# Patient Record
Sex: Female | Born: 1945
Health system: Southern US, Community
[De-identification: ages and names within clinical notes are randomized; demographics above are authoritative.]

## PROBLEM LIST (undated history)

## (undated) DIAGNOSIS — E119 Type 2 diabetes mellitus without complications: Secondary | ICD-10-CM

## (undated) DIAGNOSIS — N189 Chronic kidney disease, unspecified: Secondary | ICD-10-CM

## (undated) DIAGNOSIS — E785 Hyperlipidemia, unspecified: Secondary | ICD-10-CM

## (undated) DIAGNOSIS — N6452 Nipple discharge: Secondary | ICD-10-CM

## (undated) HISTORY — PX: OVARIAN CYST REMOVAL: SHX89

## (undated) HISTORY — PX: COLONOSCOPY: SHX174

---

## 2003-08-04 ENCOUNTER — Other Ambulatory Visit: Admission: RE | Admit: 2003-08-04 | Discharge: 2003-08-04 | Payer: Self-pay | Admitting: Family Medicine

## 2003-09-16 ENCOUNTER — Ambulatory Visit (HOSPITAL_COMMUNITY): Admission: RE | Admit: 2003-09-16 | Discharge: 2003-09-16 | Payer: Self-pay | Admitting: Gastroenterology

## 2005-07-13 ENCOUNTER — Ambulatory Visit (HOSPITAL_COMMUNITY): Admission: RE | Admit: 2005-07-13 | Discharge: 2005-07-13 | Payer: Self-pay | Admitting: Family Medicine

## 2005-08-06 ENCOUNTER — Encounter: Admission: RE | Admit: 2005-08-06 | Discharge: 2005-08-06 | Payer: Self-pay | Admitting: Family Medicine

## 2007-09-23 ENCOUNTER — Encounter: Admission: RE | Admit: 2007-09-23 | Discharge: 2007-09-23 | Payer: Self-pay | Admitting: Family Medicine

## 2010-04-30 ENCOUNTER — Encounter: Payer: Self-pay | Admitting: Family Medicine

## 2010-08-25 NOTE — Op Note (Signed)
NAMEKHALIYAH, BURROUS                           ACCOUNT NO.:  [id number]   MEDICAL RECORD NO.:  [id number]                   PATIENT TYPE:  AMB   LOCATION:  ENDO                                 FACILITY:  [facility]   PHYSICIAN:  James L. Rolla Flatten., M.D.          DATE OF BIRTH:  01/04/1946   DATE OF PROCEDURE:  09/16/2003  DATE OF DISCHARGE:                                 OPERATIVE REPORT   PROCEDURE:  Colonoscopy.   MEDICATIONS GIVEN:  Fentanyl 75 mcg, Versed 7 mg IV.   INDICATIONS FOR PROCEDURE:  Heme positive stool.   DESCRIPTION OF PROCEDURE:  The procedure was explained and patient consent  obtained.  With the patient in the left lateral decubitus position, the  Olympus pediatric scope was inserted and advanced.  The scope was withdrawn  after the cecum had been reached.  The ileocecal valve and appendiceal  orifice were seen.  The cecum, ascending colon, transverse colon, descending  and sigmoid colon were seen well.  The patient had pandiverticulosis,  multiple large and small amount of diverticula extending throughout the  entire colon.  No polyps seen throughout the entire colon.  The sigmoid  colon, in particular, did reveal marked diverticular disease.  The scope was  withdrawn to the rectum.  The rectum was free of polyps and other lesions.  There were moderate internal hemorrhoids seen in the rectum upon the removal  of the scope.  The scope was withdrawn.  The patient tolerated the procedure  well and was maintained on low flow oxygen and pulse oximetry throughout the  procedure.   ASSESSMENT:  1. Heme positive stool, 792.1, no obvious cause on colonoscopy other than     hemorrhoids.  2. Pandiverticulosis.   PLAN:  Will see back in the office in 3-4 months.  Will give a  diverticulosis information sheet.                                               James L. Rolla Flatten., M.D.    Jaynie Bream  D:  09/16/2003  T:  09/16/2003  Job:  [id number]   cc:   Judithann Sauger,  M.D.  [address] [place]  [place] [address]  Fax: [phone]

## 2011-03-14 ENCOUNTER — Other Ambulatory Visit: Payer: Self-pay | Admitting: Family Medicine

## 2011-03-14 DIAGNOSIS — Z1231 Encounter for screening mammogram for malignant neoplasm of breast: Secondary | ICD-10-CM

## 2011-04-17 ENCOUNTER — Ambulatory Visit: Payer: Self-pay

## 2011-04-17 ENCOUNTER — Other Ambulatory Visit: Payer: Self-pay | Admitting: Family Medicine

## 2011-04-17 ENCOUNTER — Ambulatory Visit
Admission: RE | Admit: 2011-04-17 | Discharge: 2011-04-17 | Disposition: A | Discharge status: Home / Self Care | Payer: PRIVATE HEALTH INSURANCE | Source: Ambulatory Visit | Attending: Family Medicine | Admitting: Family Medicine

## 2011-04-17 DIAGNOSIS — N6315 Unspecified lump in the right breast, overlapping quadrants: Secondary | ICD-10-CM

## 2011-04-17 DIAGNOSIS — N6310 Unspecified lump in the right breast, unspecified quadrant: Secondary | ICD-10-CM

## 2013-03-27 ENCOUNTER — Other Ambulatory Visit: Payer: Self-pay

## 2013-03-27 DIAGNOSIS — Z1231 Encounter for screening mammogram for malignant neoplasm of breast: Secondary | ICD-10-CM

## 2013-04-03 ENCOUNTER — Ambulatory Visit
Admission: RE | Admit: 2013-04-03 | Discharge: 2013-04-03 | Disposition: A | Discharge status: Home / Self Care | Payer: PRIVATE HEALTH INSURANCE | Source: Ambulatory Visit

## 2013-04-03 DIAGNOSIS — Z1231 Encounter for screening mammogram for malignant neoplasm of breast: Secondary | ICD-10-CM

## 2014-02-19 ENCOUNTER — Other Ambulatory Visit: Payer: Self-pay

## 2014-02-19 DIAGNOSIS — Z1231 Encounter for screening mammogram for malignant neoplasm of breast: Secondary | ICD-10-CM

## 2014-04-05 ENCOUNTER — Encounter (INDEPENDENT_AMBULATORY_CARE_PROVIDER_SITE_OTHER): Payer: Self-pay

## 2014-04-05 ENCOUNTER — Ambulatory Visit
Admission: RE | Admit: 2014-04-05 | Discharge: 2014-04-05 | Disposition: A | Discharge status: Home / Self Care | Payer: Medicare Other | Source: Ambulatory Visit

## 2014-04-05 DIAGNOSIS — Z1231 Encounter for screening mammogram for malignant neoplasm of breast: Secondary | ICD-10-CM

## 2014-10-04 DIAGNOSIS — H40013 Open angle with borderline findings, low risk, bilateral: Secondary | ICD-10-CM | POA: Diagnosis not present

## 2015-04-06 ENCOUNTER — Other Ambulatory Visit: Payer: Self-pay

## 2015-04-06 DIAGNOSIS — Z1231 Encounter for screening mammogram for malignant neoplasm of breast: Secondary | ICD-10-CM

## 2015-04-29 ENCOUNTER — Ambulatory Visit
Admission: RE | Admit: 2015-04-29 | Discharge: 2015-04-29 | Disposition: A | Discharge status: Home / Self Care | Payer: Medicare Other | Source: Ambulatory Visit

## 2015-04-29 DIAGNOSIS — Z1231 Encounter for screening mammogram for malignant neoplasm of breast: Secondary | ICD-10-CM | POA: Diagnosis not present

## 2016-03-19 DIAGNOSIS — L03314 Cellulitis of groin: Secondary | ICD-10-CM | POA: Diagnosis not present

## 2016-03-19 DIAGNOSIS — R799 Abnormal finding of blood chemistry, unspecified: Secondary | ICD-10-CM | POA: Diagnosis not present

## 2016-03-19 DIAGNOSIS — L02214 Cutaneous abscess of groin: Secondary | ICD-10-CM | POA: Diagnosis not present

## 2016-03-19 DIAGNOSIS — L0390 Cellulitis, unspecified: Secondary | ICD-10-CM | POA: Diagnosis not present

## 2016-11-30 ENCOUNTER — Other Ambulatory Visit: Payer: Self-pay | Admitting: Family Medicine

## 2016-11-30 DIAGNOSIS — Z1231 Encounter for screening mammogram for malignant neoplasm of breast: Secondary | ICD-10-CM

## 2016-12-03 ENCOUNTER — Ambulatory Visit
Admission: RE | Admit: 2016-12-03 | Discharge: 2016-12-03 | Disposition: A | Discharge status: Home / Self Care | Payer: Medicare Other | Source: Ambulatory Visit | Attending: Family Medicine | Admitting: Family Medicine

## 2016-12-03 DIAGNOSIS — Z1231 Encounter for screening mammogram for malignant neoplasm of breast: Secondary | ICD-10-CM

## 2017-02-13 DIAGNOSIS — E1165 Type 2 diabetes mellitus with hyperglycemia: Secondary | ICD-10-CM | POA: Diagnosis not present

## 2017-02-13 DIAGNOSIS — B029 Zoster without complications: Secondary | ICD-10-CM | POA: Diagnosis not present

## 2017-03-12 DIAGNOSIS — Z23 Encounter for immunization: Secondary | ICD-10-CM | POA: Diagnosis not present

## 2017-04-01 DIAGNOSIS — N183 Chronic kidney disease, stage 3 (moderate): Secondary | ICD-10-CM | POA: Diagnosis not present

## 2017-04-01 DIAGNOSIS — R5383 Other fatigue: Secondary | ICD-10-CM | POA: Diagnosis not present

## 2017-04-01 DIAGNOSIS — E1165 Type 2 diabetes mellitus with hyperglycemia: Secondary | ICD-10-CM | POA: Diagnosis not present

## 2017-04-01 DIAGNOSIS — E663 Overweight: Secondary | ICD-10-CM | POA: Diagnosis not present

## 2017-04-01 DIAGNOSIS — R000 Tachycardia, unspecified: Secondary | ICD-10-CM | POA: Diagnosis not present

## 2017-04-01 DIAGNOSIS — E1122 Type 2 diabetes mellitus with diabetic chronic kidney disease: Secondary | ICD-10-CM | POA: Diagnosis not present

## 2017-04-01 DIAGNOSIS — Z780 Asymptomatic menopausal state: Secondary | ICD-10-CM | POA: Diagnosis not present

## 2017-04-30 DIAGNOSIS — Z780 Asymptomatic menopausal state: Secondary | ICD-10-CM | POA: Diagnosis not present

## 2017-06-03 DIAGNOSIS — D72828 Other elevated white blood cell count: Secondary | ICD-10-CM | POA: Diagnosis not present

## 2017-06-03 DIAGNOSIS — Z23 Encounter for immunization: Secondary | ICD-10-CM | POA: Diagnosis not present

## 2017-06-03 DIAGNOSIS — N183 Chronic kidney disease, stage 3 (moderate): Secondary | ICD-10-CM | POA: Diagnosis not present

## 2017-06-03 DIAGNOSIS — E559 Vitamin D deficiency, unspecified: Secondary | ICD-10-CM | POA: Diagnosis not present

## 2017-06-03 DIAGNOSIS — E1122 Type 2 diabetes mellitus with diabetic chronic kidney disease: Secondary | ICD-10-CM | POA: Diagnosis not present

## 2017-06-03 DIAGNOSIS — R000 Tachycardia, unspecified: Secondary | ICD-10-CM | POA: Diagnosis not present

## 2017-06-03 DIAGNOSIS — Z7984 Long term (current) use of oral hypoglycemic drugs: Secondary | ICD-10-CM | POA: Diagnosis not present

## 2017-07-23 DIAGNOSIS — E118 Type 2 diabetes mellitus with unspecified complications: Secondary | ICD-10-CM | POA: Diagnosis not present

## 2017-09-06 DIAGNOSIS — E559 Vitamin D deficiency, unspecified: Secondary | ICD-10-CM | POA: Diagnosis not present

## 2017-09-06 DIAGNOSIS — Z6828 Body mass index (BMI) 28.0-28.9, adult: Secondary | ICD-10-CM | POA: Diagnosis not present

## 2017-09-06 DIAGNOSIS — E1122 Type 2 diabetes mellitus with diabetic chronic kidney disease: Secondary | ICD-10-CM | POA: Diagnosis not present

## 2017-09-06 DIAGNOSIS — N6452 Nipple discharge: Secondary | ICD-10-CM | POA: Diagnosis not present

## 2017-09-06 DIAGNOSIS — N183 Chronic kidney disease, stage 3 (moderate): Secondary | ICD-10-CM | POA: Diagnosis not present

## 2017-09-09 ENCOUNTER — Other Ambulatory Visit: Payer: Self-pay | Admitting: Family Medicine

## 2017-09-09 DIAGNOSIS — N6452 Nipple discharge: Secondary | ICD-10-CM

## 2017-09-11 ENCOUNTER — Other Ambulatory Visit: Payer: Self-pay | Admitting: Family Medicine

## 2017-09-11 ENCOUNTER — Ambulatory Visit
Admission: RE | Admit: 2017-09-11 | Discharge: 2017-09-11 | Disposition: A | Discharge status: Home / Self Care | Payer: Medicare Other | Source: Ambulatory Visit | Attending: Family Medicine | Admitting: Family Medicine

## 2017-09-11 DIAGNOSIS — N6320 Unspecified lump in the left breast, unspecified quadrant: Secondary | ICD-10-CM

## 2017-09-11 DIAGNOSIS — N6452 Nipple discharge: Secondary | ICD-10-CM

## 2017-09-11 DIAGNOSIS — R928 Other abnormal and inconclusive findings on diagnostic imaging of breast: Secondary | ICD-10-CM | POA: Diagnosis not present

## 2017-09-12 DIAGNOSIS — E118 Type 2 diabetes mellitus with unspecified complications: Secondary | ICD-10-CM | POA: Diagnosis not present

## 2017-09-13 ENCOUNTER — Other Ambulatory Visit: Payer: Self-pay | Admitting: Family Medicine

## 2017-09-13 ENCOUNTER — Ambulatory Visit
Admission: RE | Admit: 2017-09-13 | Discharge: 2017-09-13 | Disposition: A | Discharge status: Home / Self Care | Payer: Medicare Other | Source: Ambulatory Visit | Attending: Family Medicine | Admitting: Family Medicine

## 2017-09-13 DIAGNOSIS — N6320 Unspecified lump in the left breast, unspecified quadrant: Secondary | ICD-10-CM

## 2017-09-13 DIAGNOSIS — N6342 Unspecified lump in left breast, subareolar: Secondary | ICD-10-CM | POA: Diagnosis not present

## 2017-09-13 DIAGNOSIS — N6452 Nipple discharge: Secondary | ICD-10-CM | POA: Diagnosis not present

## 2017-09-27 DIAGNOSIS — N6452 Nipple discharge: Secondary | ICD-10-CM | POA: Diagnosis not present

## 2017-10-02 ENCOUNTER — Ambulatory Visit: Payer: Self-pay | Admitting: Surgery

## 2017-10-22 DIAGNOSIS — K5730 Diverticulosis of large intestine without perforation or abscess without bleeding: Secondary | ICD-10-CM | POA: Diagnosis not present

## 2017-10-22 DIAGNOSIS — Z1211 Encounter for screening for malignant neoplasm of colon: Secondary | ICD-10-CM | POA: Diagnosis not present

## 2017-10-22 DIAGNOSIS — K640 First degree hemorrhoids: Secondary | ICD-10-CM | POA: Diagnosis not present

## 2017-10-22 DIAGNOSIS — D126 Benign neoplasm of colon, unspecified: Secondary | ICD-10-CM | POA: Diagnosis not present

## 2017-10-23 ENCOUNTER — Encounter (HOSPITAL_BASED_OUTPATIENT_CLINIC_OR_DEPARTMENT_OTHER): Payer: Self-pay | Admitting: *Deleted

## 2017-10-23 ENCOUNTER — Other Ambulatory Visit: Payer: Self-pay

## 2017-10-25 ENCOUNTER — Encounter (HOSPITAL_BASED_OUTPATIENT_CLINIC_OR_DEPARTMENT_OTHER)
Admission: RE | Admit: 2017-10-25 | Discharge: 2017-10-25 | Disposition: A | Discharge status: Home / Self Care | Payer: Medicare Other | Source: Ambulatory Visit | Attending: Surgery | Admitting: Surgery

## 2017-10-25 DIAGNOSIS — E119 Type 2 diabetes mellitus without complications: Secondary | ICD-10-CM | POA: Diagnosis not present

## 2017-10-25 DIAGNOSIS — D126 Benign neoplasm of colon, unspecified: Secondary | ICD-10-CM | POA: Diagnosis not present

## 2017-10-25 DIAGNOSIS — Z01810 Encounter for preprocedural cardiovascular examination: Secondary | ICD-10-CM | POA: Diagnosis not present

## 2017-10-25 DIAGNOSIS — Z01812 Encounter for preprocedural laboratory examination: Secondary | ICD-10-CM | POA: Insufficient documentation

## 2017-10-25 LAB — BASIC METABOLIC PANEL
ANION GAP: 9 (ref 5–15)
BUN: 14 mg/dL (ref 8–23)
CALCIUM: 9.5 mg/dL (ref 8.9–10.3)
CO2: 27 mmol/L (ref 22–32)
Chloride: 106 mmol/L (ref 98–111)
Creatinine, Ser: 0.89 mg/dL (ref 0.44–1.00)
GFR calc Af Amer: >60 mL/min (ref >60)
GLUCOSE: 180 mg/dL — AB (ref 70–99)
Potassium: 4.2 mmol/L (ref 3.5–5.1)
Sodium: 142 mmol/L (ref 135–145)

## 2017-10-25 NOTE — Progress Notes (Signed)
Ensure pre surgery drink given with instructions to complete by 0600 dos, pt verbalized understanding. 

## 2017-10-30 NOTE — H&P (Signed)
Tamara Pruitt Location: [facility] Patient #: [id number] DOB: 03/14/1946 Undefined / Language: Tamara Pruitt / Race: White Female  History of Present Illness   The patient is a 72 year old female who presents with a complaint of changes in breast.  The PCP is Dr. Kathyrn Lass  The patient was referred by Dr. Kathyrn Lass  She comes by herself.  She took care of a sick husband for quite some time. He died in 07/24/15. She neglected herself while taking care of him. She is now trying to get back into a healthy lifestyle. She had noticed a left nipple discharge for couple months. At first she did not realize this was coming from her nipple. She is not on hormone medicine. She has a strong family history of breast cancer in that her grandmother, mother, and sister all had breast cancer. She knows of family genetic testing. She has had no prior breast biopsies.  Left breast biopsy on 09/13/2017 [phone]) showed benign breast tissue. This biospy was considered concordant. I gave her a copy of the path report. She still has a nipple discharge. I spoke to Dr. Curlene Dolphin. There is no reason to get the clip out (though it may out with the specimen). She has a persistent nipple discharge despite a benign biopsy. She has a strong family history of breast cancer. I think she is best served by having a left breast biopsy to excise the ductal system causing the discharge. I can feel a duct that comes out at 3:00 and that releases bloody fluid when I press on it.  Plan: 1) Left breast biopsy to excise duct causing discharge  Past Medical History: 1. DM - on metformin 2. CKD III 3. She is on a statin 4. She had abdominal surgery for an ovarian cyst in 1987. 5. She had endometriosis 6. She is up for a colonsopy by Dr. Oletta Lamas  Social History: Widowed. Lives by self. She has one child: 38 yo daughter  Tamara Pruitt retired. 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   Past Surgical History (Tanisha A. Owens Shark, [place]; 09/27/2017 4:24 PM) Breast Biopsy  Left. Cesarean Section - 1  Gallbladder Surgery - Laparoscopic   Diagnostic Studies History (Tanisha A. Owens Shark, [place]; 09/27/2017 4:24 PM) Colonoscopy  >10 years ago Mammogram  within last year Pap Smear  >5 years ago  Allergies (Tanisha A. Owens Shark, [place]; 09/27/2017 4:25 PM) No Known Drug Allergies [09/27/2017]: Allergies Reconciled   Medication History (Tanisha A. Owens Shark, RMA; 09/27/2017 4:25 PM) MetFORMIN HCl (500MG  Tablet, Oral) Active. Vitamin D (Ergocalciferol) (50000UNIT Capsule, Oral) Active. Medications Reconciled  Social History (Tanisha A. Owens Shark, [place]; 09/27/2017 4:24 PM) Caffeine use  Coffee, Tea. No alcohol use  No drug use  Tobacco use  Never smoker.  Family History (Tanisha A. Owens Shark, [place]; 09/27/2017 4:24 PM) Arthritis  Father. Breast Cancer  Mother, Sister. Heart Disease  Sister.  Pregnancy / Birth History (Tanisha A. Owens Shark, [place]; 09/27/2017 4:24 PM) Age at menarche  48 years. Age of menopause  12-60 Contraceptive History  Intrauterine device, Oral contraceptives. Gravida  2 Irregular periods  Length (months) of breastfeeding  7-12 Maternal age  40-20 Para  1  Other Problems (Tanisha A. Owens Shark, [place]; 09/27/2017 4:24 PM) Diabetes Mellitus     Review of Systems (Tanisha A. Brown RMA; 09/27/2017 4:24 PM) General Not Present- Appetite Loss, Chills, Fatigue, Fever, Night Sweats, Weight Gain and Weight Loss. Skin Not  Present- Change in Wart/Mole, Dryness, Hives, Jaundice, New Lesions, Non-Healing Wounds, Rash and Ulcer. HEENT Not Present- Earache, Hearing Loss, Hoarseness, Nose Bleed, Oral Ulcers, Ringing in the Ears, Seasonal Allergies, Sinus Pain, Sore Throat, Visual Disturbances, Wears glasses/contact lenses and Yellow Eyes. Respiratory Not Present- Bloody sputum, Chronic Cough, Difficulty  Breathing, Snoring and Wheezing. Breast Present- Nipple Discharge. Not Present- Breast Mass, Breast Pain and Skin Changes. Cardiovascular Not Present- Chest Pain, Difficulty Breathing Lying Down, Leg Cramps, Palpitations, Rapid Heart Rate, Shortness of Breath and Swelling of Extremities. Gastrointestinal Not Present- Abdominal Pain, Bloating, Bloody Stool, Change in Bowel Habits, Chronic diarrhea, Constipation, Difficulty Swallowing, Excessive gas, Gets full quickly at meals, Hemorrhoids, Indigestion, Nausea, Rectal Pain and Vomiting. Female Genitourinary Not Present- Frequency, Nocturia, Painful Urination, Pelvic Pain and Urgency. Musculoskeletal Not Present- Back Pain, Joint Pain, Joint Stiffness, Muscle Pain, Muscle Weakness and Swelling of Extremities. Neurological Not Present- Decreased Memory, Fainting, Headaches, Numbness, Seizures, Tingling, Tremor, Trouble walking and Weakness. Psychiatric Not Present- Anxiety, Bipolar, Change in Sleep Pattern, Depression, Fearful and Frequent crying. Endocrine Not Present- Cold Intolerance, Excessive Hunger, Hair Changes, Heat Intolerance, Hot flashes and New Diabetes. Hematology Not Present- Blood Thinners, Easy Bruising, Excessive bleeding, Gland problems, HIV and Persistent Infections.  Vitals (Tanisha A. Brown RMA; 09/27/2017 4:24 PM) 09/27/2017 4:24 PM Weight: 174.4 lb Height: 65in Body Surface Area: 1.87 m Body Mass Index: 29.02 kg/m  Temp.: 98.73F  Pulse: 78 (Regular)  BP: 128/72 (Sitting, Left Arm, Standard)   Physical Exam  General: Older WF who is alert and generally healthy appearing. Skin: Inspection and palpation of the skin unremarkable.  Eyes: Conjunctivae white, pupils equal. Face, ears, nose, mouth, and throat: Face - normal. Normal ears and nose. Lips and teeth normal.  Neck: Supple. No mass. Trachea midline. No thyroid mass.  Lymph Nodes: No supraclavicular or cervical adenopathy. No axillary  adenopathy.  Lungs: Normal respiratory effort. Clear to auscultation and symmetric breath sounds. Cardiovascular: Regular rate and rythm. Normal auscultation of the heart. No murmur or rub.  Breasts: Right - no mass or nodule Left - I can feel a "cord" that is coming out at 3 o'clock of the left nipple. On pressure, I can produce a very small amount of dark bloody fluid. I think this is the abnormal duct  Abdomen: Soft. No mass. Liver and spleen not palpable. No tenderness. No hernia. Normal bowel sounds.  Rectal: Not done.  Musculoskeletal/extremities: Normal gait. Good strength and ROM in upper and lower extremities.   Neurologic: Grossly intact to motor and sensory function.   Psychiatric: Has normal mood and affect. Judgement and insight appear normal.  Assessment & Plan  1.  Left NIPPLE DISCHARGE (N64.52)  Plan:  1) Left breast biopsy  2. DM - on metformin 3. CKD III 4. She is on a statin 5. She had abdominal surgery for an ovarian cyst in 1987. 6. She had endometriosis   Alphonsa Overall, MD, [facility] Pager: [phone] Office phone:  [phone]

## 2017-10-31 ENCOUNTER — Ambulatory Visit (HOSPITAL_BASED_OUTPATIENT_CLINIC_OR_DEPARTMENT_OTHER)
Admission: RE | Admit: 2017-10-31 | Discharge: 2017-10-31 | Disposition: A | Discharge status: Home / Self Care | Payer: Medicare Other | Source: Ambulatory Visit | Attending: Surgery | Admitting: Surgery

## 2017-10-31 ENCOUNTER — Ambulatory Visit (HOSPITAL_BASED_OUTPATIENT_CLINIC_OR_DEPARTMENT_OTHER): Payer: Medicare Other | Admitting: Certified Registered"

## 2017-10-31 ENCOUNTER — Encounter (HOSPITAL_BASED_OUTPATIENT_CLINIC_OR_DEPARTMENT_OTHER)
Admission: RE | Disposition: A | Discharge status: Home / Self Care | Payer: Self-pay | Source: Ambulatory Visit | Attending: Surgery

## 2017-10-31 ENCOUNTER — Other Ambulatory Visit: Payer: Self-pay

## 2017-10-31 ENCOUNTER — Encounter (HOSPITAL_BASED_OUTPATIENT_CLINIC_OR_DEPARTMENT_OTHER): Payer: Self-pay

## 2017-10-31 DIAGNOSIS — N6042 Mammary duct ectasia of left breast: Secondary | ICD-10-CM | POA: Diagnosis not present

## 2017-10-31 DIAGNOSIS — E119 Type 2 diabetes mellitus without complications: Secondary | ICD-10-CM | POA: Diagnosis not present

## 2017-10-31 DIAGNOSIS — Z7984 Long term (current) use of oral hypoglycemic drugs: Secondary | ICD-10-CM | POA: Diagnosis not present

## 2017-10-31 DIAGNOSIS — N6452 Nipple discharge: Secondary | ICD-10-CM | POA: Insufficient documentation

## 2017-10-31 HISTORY — PX: BREAST BIOPSY: SHX20

## 2017-10-31 HISTORY — DX: Chronic kidney disease, unspecified: N18.9

## 2017-10-31 HISTORY — DX: Nipple discharge: N64.52

## 2017-10-31 HISTORY — DX: Type 2 diabetes mellitus without complications: E11.9

## 2017-10-31 HISTORY — DX: Hyperlipidemia, unspecified: E78.5

## 2017-10-31 LAB — GLUCOSE, CAPILLARY
Glucose-Capillary: 243 mg/dL — ABNORMAL HIGH (ref 70–99)
Glucose-Capillary: 181 mg/dL — ABNORMAL HIGH (ref 70–99)

## 2017-10-31 SURGERY — BREAST BIOPSY
Anesthesia: General | Site: Breast | Laterality: Left

## 2017-10-31 MED ORDER — ONDANSETRON HCL 4 MG/2ML IJ SOLN
INTRAMUSCULAR | Status: AC
  Filled 2017-10-31: qty 2

## 2017-10-31 MED ORDER — CHLORHEXIDINE GLUCONATE CLOTH 2 % EX PADS: 6.0000 | MEDICATED_PAD | Freq: Once | CUTANEOUS | Status: DC

## 2017-10-31 MED ORDER — MIDAZOLAM HCL 2 MG/2ML IJ SOLN
INTRAMUSCULAR | Status: AC
  Filled 2017-10-31: qty 2

## 2017-10-31 MED ORDER — SCOPOLAMINE 1 MG/3DAYS TD PT72: 1.0000 | MEDICATED_PATCH | Freq: Once | TRANSDERMAL | Status: DC | PRN

## 2017-10-31 MED ORDER — CEFAZOLIN SODIUM-DEXTROSE 2-4 GM/100ML-% IV SOLN
INTRAVENOUS | Status: AC
  Filled 2017-10-31: qty 100

## 2017-10-31 MED ORDER — 0.9 % SODIUM CHLORIDE (POUR BTL) OPTIME
TOPICAL | Status: DC | PRN
  Administered 2017-10-31: 1000 mL

## 2017-10-31 MED ORDER — EPHEDRINE SULFATE 50 MG/ML IJ SOLN
INTRAMUSCULAR | Status: DC | PRN
  Administered 2017-10-31: 10 mg via INTRAVENOUS

## 2017-10-31 MED ORDER — PROPOFOL 10 MG/ML IV BOLUS
INTRAVENOUS | Status: AC
  Filled 2017-10-31: qty 20

## 2017-10-31 MED ORDER — LIDOCAINE HCL (CARDIAC) PF 100 MG/5ML IV SOSY
PREFILLED_SYRINGE | INTRAVENOUS | Status: AC
  Filled 2017-10-31: qty 5

## 2017-10-31 MED ORDER — LIDOCAINE HCL (CARDIAC) PF 100 MG/5ML IV SOSY
PREFILLED_SYRINGE | INTRAVENOUS | Status: DC | PRN
  Administered 2017-10-31: 60 mg via INTRAVENOUS

## 2017-10-31 MED ORDER — FENTANYL CITRATE (PF) 100 MCG/2ML IJ SOLN
INTRAMUSCULAR | Status: AC
  Filled 2017-10-31: qty 2

## 2017-10-31 MED ORDER — ACETAMINOPHEN 500 MG PO TABS
1000.0000 mg | ORAL_TABLET | ORAL | Status: AC
  Administered 2017-10-31: 1000 mg via ORAL

## 2017-10-31 MED ORDER — FENTANYL CITRATE (PF) 100 MCG/2ML IJ SOLN: 25.0000 ug | INTRAMUSCULAR | Status: DC | PRN

## 2017-10-31 MED ORDER — PROPOFOL 10 MG/ML IV BOLUS
INTRAVENOUS | Status: DC | PRN
  Administered 2017-10-31: 140 mg via INTRAVENOUS

## 2017-10-31 MED ORDER — DEXAMETHASONE SODIUM PHOSPHATE 10 MG/ML IJ SOLN
INTRAMUSCULAR | Status: AC
  Filled 2017-10-31: qty 1

## 2017-10-31 MED ORDER — FENTANYL CITRATE (PF) 100 MCG/2ML IJ SOLN
50.0000 ug | INTRAMUSCULAR | Status: DC | PRN
  Administered 2017-10-31 (×2): 50 ug via INTRAVENOUS

## 2017-10-31 MED ORDER — ONDANSETRON HCL 4 MG/2ML IJ SOLN: 4.0000 mg | Freq: Once | INTRAMUSCULAR | Status: DC | PRN

## 2017-10-31 MED ORDER — GABAPENTIN 300 MG PO CAPS
ORAL_CAPSULE | ORAL | Status: AC
  Filled 2017-10-31: qty 1

## 2017-10-31 MED ORDER — GABAPENTIN 300 MG PO CAPS
300.0000 mg | ORAL_CAPSULE | ORAL | Status: AC
  Administered 2017-10-31: 300 mg via ORAL

## 2017-10-31 MED ORDER — CEFAZOLIN SODIUM-DEXTROSE 2-4 GM/100ML-% IV SOLN: 2.0000 g | INTRAVENOUS | Status: DC

## 2017-10-31 MED ORDER — HYDROCODONE-ACETAMINOPHEN 5-325 MG PO TABS: 1.0000 | ORAL_TABLET | Freq: Four times a day (QID) | ORAL | 0 refills | Status: DC | PRN

## 2017-10-31 MED ORDER — ACETAMINOPHEN 500 MG PO TABS
ORAL_TABLET | ORAL | Status: AC
  Filled 2017-10-31: qty 2

## 2017-10-31 MED ORDER — BUPIVACAINE-EPINEPHRINE (PF) 0.25% -1:200000 IJ SOLN
INTRAMUSCULAR | Status: DC | PRN
  Administered 2017-10-31: 30 mL

## 2017-10-31 MED ORDER — MIDAZOLAM HCL 2 MG/2ML IJ SOLN
1.0000 mg | INTRAMUSCULAR | Status: DC | PRN
  Administered 2017-10-31: 1 mg via INTRAVENOUS

## 2017-10-31 MED ORDER — LACTATED RINGERS IV SOLN
INTRAVENOUS | Status: DC
  Administered 2017-10-31: 09:00:00 via INTRAVENOUS

## 2017-10-31 MED ORDER — ONDANSETRON HCL 4 MG/2ML IJ SOLN
INTRAMUSCULAR | Status: DC | PRN
Start: 2017-10-31 — End: 2017-10-31
  Administered 2017-10-31: 4 mg via INTRAVENOUS

## 2017-10-31 MED ORDER — CEFAZOLIN SODIUM-DEXTROSE 2-3 GM-%(50ML) IV SOLR
INTRAVENOUS | Status: DC | PRN
  Administered 2017-10-31: 2 g via INTRAVENOUS

## 2017-10-31 SURGICAL SUPPLY — 45 items
BENZOIN TINCTURE PRP APPL 2/3 (GAUZE/BANDAGES/DRESSINGS) IMPLANT
BINDER BREAST LRG (GAUZE/BANDAGES/DRESSINGS) IMPLANT
BINDER BREAST MEDIUM (GAUZE/BANDAGES/DRESSINGS) IMPLANT
BINDER BREAST XLRG (GAUZE/BANDAGES/DRESSINGS) ×3 IMPLANT
BINDER BREAST XXLRG (GAUZE/BANDAGES/DRESSINGS) IMPLANT
BLADE SURG 10 STRL SS (BLADE) ×3 IMPLANT
BLADE SURG 15 STRL LF DISP TIS (BLADE) IMPLANT
BLADE SURG 15 STRL SS (BLADE)
CANISTER SUCT 1200ML W/VALVE (MISCELLANEOUS) ×3 IMPLANT
CHLORAPREP W/TINT 26ML (MISCELLANEOUS) ×3 IMPLANT
CLIP VESOCCLUDE SM WIDE 6/CT (CLIP) IMPLANT
CLOSURE WOUND 1/4X4 (GAUZE/BANDAGES/DRESSINGS)
COVER BACK TABLE 60X90IN (DRAPES) ×3 IMPLANT
COVER MAYO STAND STRL (DRAPES) ×3 IMPLANT
DECANTER SPIKE VIAL GLASS SM (MISCELLANEOUS) IMPLANT
DERMABOND ADVANCED (GAUZE/BANDAGES/DRESSINGS)
DERMABOND ADVANCED .7 DNX12 (GAUZE/BANDAGES/DRESSINGS) IMPLANT
DEVICE DUBIN W/COMP PLATE 8390 (MISCELLANEOUS) IMPLANT
DRAPE LAPAROTOMY 100X72 PEDS (DRAPES) ×3 IMPLANT
DRAPE UTILITY XL STRL (DRAPES) ×3 IMPLANT
ELECT COATED BLADE 2.86 ST (ELECTRODE) ×3 IMPLANT
ELECT REM PT RETURN 9FT ADLT (ELECTROSURGICAL) ×3
ELECTRODE REM PT RTRN 9FT ADLT (ELECTROSURGICAL) ×1 IMPLANT
GAUZE SPONGE 4X4 12PLY STRL LF (GAUZE/BANDAGES/DRESSINGS) IMPLANT
GLOVE SURG SIGNA 7.5 PF LTX (GLOVE) ×3 IMPLANT
GOWN STRL REUS W/ TWL LRG LVL3 (GOWN DISPOSABLE) ×1 IMPLANT
GOWN STRL REUS W/ TWL XL LVL3 (GOWN DISPOSABLE) ×1 IMPLANT
GOWN STRL REUS W/TWL LRG LVL3 (GOWN DISPOSABLE) ×2
GOWN STRL REUS W/TWL XL LVL3 (GOWN DISPOSABLE) ×2
KIT MARKER MARGIN INK (KITS) IMPLANT
NEEDLE HYPO 25X1 1.5 SAFETY (NEEDLE) ×3 IMPLANT
NS IRRIG 1000ML POUR BTL (IV SOLUTION) ×3 IMPLANT
PACK BASIN DAY SURGERY FS (CUSTOM PROCEDURE TRAY) ×3 IMPLANT
PENCIL BUTTON HOLSTER BLD 10FT (ELECTRODE) ×3 IMPLANT
SHEET MEDIUM DRAPE 40X70 STRL (DRAPES) IMPLANT
SLEEVE SCD COMPRESS KNEE MED (MISCELLANEOUS) ×3 IMPLANT
SPONGE LAP 18X18 RF (DISPOSABLE) ×3 IMPLANT
STRIP CLOSURE SKIN 1/4X4 (GAUZE/BANDAGES/DRESSINGS) IMPLANT
SUT MNCRL AB 4-0 PS2 18 (SUTURE) ×3 IMPLANT
SUT VICRYL 3-0 CR8 SH (SUTURE) ×3 IMPLANT
SYR CONTROL 10ML LL (SYRINGE) ×3 IMPLANT
TOWEL OR NON WOVEN STRL DISP B (DISPOSABLE) ×3 IMPLANT
TUBE CONNECTING 20'X1/4 (TUBING) ×1
TUBE CONNECTING 20X1/4 (TUBING) ×2 IMPLANT
YANKAUER SUCT BULB TIP NO VENT (SUCTIONS) ×3 IMPLANT

## 2017-10-31 NOTE — Transfer of Care (Signed)
Immediate Anesthesia Transfer of Care Note  Patient: Tamara Pruitt  Procedure(s) Performed: LEFT BREAST BIOPSY (Left Breast)  Patient Location: PACU  Anesthesia Type:General  Level of Consciousness: awake, alert  and oriented  Airway & Oxygen Therapy: Patient Spontanous Breathing and Patient connected to face mask oxygen  Post-op Assessment: Report given to RN and Post -op Vital signs reviewed and stable  Post vital signs: Reviewed and stable  Last Vitals:  Vitals Value Taken Time  BP 126/81 10/31/2017 10:12 AM  Temp    Pulse 123 10/31/2017 10:13 AM  Resp 13 10/31/2017 10:13 AM  SpO2 97 % 10/31/2017 10:13 AM  Vitals shown include unvalidated device data.  Last Pain:  Vitals:   10/31/17 0825  TempSrc: Oral  PainSc: 0-No pain         Complications: No apparent anesthesia complications

## 2017-10-31 NOTE — Discharge Instructions (Signed)
[facility] - DISCHARGE INSTRUCTIONS TO PATIENT  Activity:  Driving - May drive in 1 or 2 days   Lifting - no lifting more than 15 pounds for 5 days, then no limit  Wound Care:   Leave bandage for 2 days.  Then remove bandage and shower.  Diet:  As tolerated.  Follow up appointment:  Call Dr. Pollie Friar office [facility]) at [phone] for an appointment in 2 to 4 weeks.  Medications and dosages:  Resume your home medications.  You have a prescription for:  Vicodin.  Call Dr. Lucia Gaskins or his office  [phone]) if you have:  Temperature greater than 100.4,  Persistent nausea and vomiting,  Severe uncontrolled pain,  Redness, tenderness, or signs of infection (pain, swelling, redness, odor or green/yellow discharge around the site),  Difficulty breathing, headache or visual disturbances,  Any other questions or concerns you may have after discharge.  In an emergency, call 911 or go to an Emergency Department at a nearby hospital.    Post Anesthesia Home Care Instructions  Activity: Get plenty of rest for the remainder of the day. A responsible individual must stay with you for 24 hours following the procedure.  For the next 24 hours, DO NOT: -Drive a car -Paediatric nurse -Drink alcoholic beverages -Take any medication unless instructed by your physician -Make any legal decisions or sign important papers.  Meals: Start with liquid foods such as gelatin or soup. Progress to regular foods as tolerated. Avoid greasy, spicy, heavy foods. If nausea and/or vomiting occur, drink only clear liquids until the nausea and/or vomiting subsides. Call your physician if vomiting continues.  Special Instructions/Symptoms: Your throat may feel dry or sore from the anesthesia or the breathing tube placed in your throat during surgery. If this causes discomfort, gargle with warm salt water. The discomfort should disappear within 24 hours.  If you had a  scopolamine patch placed behind your ear for the management of post- operative nausea and/or vomiting:  1. The medication in the patch is effective for 72 hours, after which it should be removed.  Wrap patch in a tissue and discard in the trash. Wash hands thoroughly with soap and water. 2. You may remove the patch earlier than 72 hours if you experience unpleasant side effects which may include dry mouth, dizziness or visual disturbances. 3. Avoid touching the patch. Wash your hands with soap and water after contact with the patch.   Call your surgeon if you experience:   1.  Fever over 101.0. 2.  Inability to urinate. 3.  Nausea and/or vomiting. 4.  Extreme swelling or bruising at the surgical site. 5.  Continued bleeding from the incision. 6.  Increased pain, redness or drainage from the incision. 7.  Problems related to your pain medication. 8.  Any problems and/or concerns

## 2017-10-31 NOTE — Anesthesia Procedure Notes (Signed)
Procedure Name: LMA Insertion Performed by: Verita Lamb, CRNA Pre-anesthesia Checklist: Patient identified, Emergency Drugs available, Suction available, Patient being monitored and Timeout performed Patient Re-evaluated:Patient Re-evaluated prior to induction Oxygen Delivery Method: Circle system utilized Preoxygenation: Pre-oxygenation with 100% oxygen Induction Type: IV induction LMA: LMA inserted LMA Size: 4.0 Tube type: Oral Number of attempts: 1 Placement Confirmation: positive ETCO2,  CO2 detector and breath sounds checked- equal and bilateral Tube secured with: Tape Dental Injury: Teeth and Oropharynx as per pre-operative assessment

## 2017-10-31 NOTE — Op Note (Addendum)
10/31/2017  10:06 AM  PATIENT:  Tamara Pruitt DOB: June 25, 1945 MRN: [id number]  PREOP DIAGNOSIS:  Left nipple discharge  POSTOP DIAGNOSIS:   Left nipple discharge, 3 o'clock position   PROCEDURE:   Procedure(s): LEFT BREAST Lumpecomty  SURGEON:   Alphonsa Overall, M.D.  ANESTHESIA:   general  Anesthesiologist: Catalina Gravel, MD CRNA: Verita Lamb, CRNA  General  EBL:  minimal  ml  DRAINS: none   LOCAL MEDICATIONS USED:   30 cc 1/4% marcaine  SPECIMEN:   Left breast biopsy (6 color paint kit)  COUNTS CORRECT:  YES  INDICATIONS FOR PROCEDURE:  [name] is a 72 y.o. (DOB: 1946-03-07) white female whose primary care physician is Kathyrn Lass, MD and comes for left breast lumpectomy.   She underwent a left breast biopsy on 09/13/2017 [phone]) showed benign breast tissue. This biospy was considered concordant.   I spoke to Dr. Curlene Dolphin. There is no reason to get the clip out (though it may out with the specimen).   The options for  treatment have been discussed with the patient. She elected to proceed with lumpectomy and axillary sentinel lymph node.     The indications and potential complications of surgery were explained to the patient. Potential complications include, but are not limited to, bleeding, infection, the need for further surgery, and nerve injury.    OPERATIVE NOTE:   The patient was taken to room # 8 at [facility] where she underwent a general anesthesia  supervised by Anesthesiologist: Catalina Gravel, MD CRNA: Verita Lamb, CRNA. Her left breast and axilla were prepped with  ChloraPrep and sterilely draped.    A time-out and the surgical check list was reviewed.    She has a recurrent nipple discharge of her left breast.  She has a palpable duct at the 3 o'clock position, that when I pressed on it produced a bloody nipple discharge.   I made a lateral circumareolar incision.  I excised this block of breast tissue  approximately 3 cm by 3 cm  in diameter.  I cut across some ducts that were dilated with pasty material.   I painted the biopsy specimen with the 6 color paint kit.     I then irrigated the wound with saline. I infiltrated approximately 30 mL of 1/4% marcaine in the incision.  I then closed all the wounds in layers using 3-0 Vicryl sutures for the deep layer. At the skin, I closed the incisions with a 4-0 Monocryl suture. The incisions were then painted with LiquiBand.  She had gauze place over the wounds and placed in a breast binder.   The patient tolerated the procedure well, was transported to the recovery room in good condition. Sponge and needle count were correct at the end of the case.   Final pathology is pending.   Alphonsa Overall, MD, [facility] Pager: [phone] Office phone:  [phone]

## 2017-10-31 NOTE — Anesthesia Preprocedure Evaluation (Addendum)
Anesthesia Evaluation  Patient identified by MRN, date of birth, ID band Patient awake    Reviewed: Allergy & Precautions, NPO status , Patient's Chart, lab work & pertinent test results  Airway Mallampati: II  TM Distance: >3 FB Neck ROM: Full    Dental  (+) Teeth Intact, Dental Advisory Given, Missing   Pulmonary neg pulmonary ROS,    Pulmonary exam normal breath sounds clear to auscultation       Cardiovascular Exercise Tolerance: Good (-) angina(-) Past MI negative cardio ROS Normal cardiovascular exam Rhythm:Regular Rate:Normal     Neuro/Psych negative neurological ROS  negative psych ROS   GI/Hepatic negative GI ROS, Neg liver ROS,   Endo/Other  diabetes, Type 2, Oral Hypoglycemic Agents  Renal/GU Renal InsufficiencyRenal disease     Musculoskeletal negative musculoskeletal ROS (+)   Abdominal   Peds  Hematology negative hematology ROS (+)   Anesthesia Other Findings Day of surgery medications reviewed with the patient.  Reproductive/Obstetrics                            Anesthesia Physical Anesthesia Plan  ASA: II  Anesthesia Plan: General   Post-op Pain Management:    Induction: Intravenous  PONV Risk Score and Plan: 3 and Dexamethasone and Ondansetron  Airway Management Planned: LMA  Additional Equipment:   Intra-op Plan:   Post-operative Plan: Extubation in OR  Informed Consent: I have reviewed the patients History and Physical, chart, labs and discussed the procedure including the risks, benefits and alternatives for the proposed anesthesia with the patient or authorized representative who has indicated his/her understanding and acceptance.   Dental advisory given  Plan Discussed with: CRNA  Anesthesia Plan Comments: (.)        Anesthesia Quick Evaluation

## 2017-10-31 NOTE — Interval H&P Note (Signed)
History and Physical Interval Note:  10/31/2017 9:22 AM  Tamara Pruitt  has presented today for surgery, with the diagnosis of Left nipple discharge  The various methods of treatment have been discussed with the patient and family. Grier Mitts, is with her.  She said that she has had no more discharge since I saw her in the office.   After consideration of risks, benefits and other options for treatment, the patient has consented to  Procedure(s): LEFT BREAST BIOPSY (Left) as a surgical intervention .  The patient's history has been reviewed, patient examined, no change in status, stable for surgery.  I have reviewed the patient's chart and labs.  Questions were answered to the patient's satisfaction.     Shann Medal

## 2017-11-01 ENCOUNTER — Encounter (HOSPITAL_BASED_OUTPATIENT_CLINIC_OR_DEPARTMENT_OTHER): Payer: Self-pay | Admitting: Surgery

## 2017-11-01 NOTE — Anesthesia Postprocedure Evaluation (Signed)
Anesthesia Post Note  Patient: Tamara Pruitt  Procedure(s) Performed: LEFT BREAST BIOPSY (Left Breast)     Patient location during evaluation: PACU Anesthesia Type: General Level of consciousness: awake and alert Pain management: pain level controlled Vital Signs Assessment: post-procedure vital signs reviewed and stable Respiratory status: spontaneous breathing, nonlabored ventilation and respiratory function stable Cardiovascular status: blood pressure returned to baseline and stable Postop Assessment: no apparent nausea or vomiting Anesthetic complications: no    Last Vitals:  Vitals:   10/31/17 1043 10/31/17 1055  BP:  128/84  Pulse: (!) 116 (!) 114  Resp: 18 16  Temp:  36.5 C  SpO2: 97% 95%    Last Pain:  Vitals:   10/31/17 1055  TempSrc:   PainSc: 0-No pain                 Catalina Gravel

## 2017-12-10 DIAGNOSIS — E559 Vitamin D deficiency, unspecified: Secondary | ICD-10-CM | POA: Diagnosis not present

## 2017-12-10 DIAGNOSIS — Z6829 Body mass index (BMI) 29.0-29.9, adult: Secondary | ICD-10-CM | POA: Diagnosis not present

## 2017-12-10 DIAGNOSIS — E1122 Type 2 diabetes mellitus with diabetic chronic kidney disease: Secondary | ICD-10-CM | POA: Diagnosis not present

## 2017-12-10 DIAGNOSIS — N183 Chronic kidney disease, stage 3 (moderate): Secondary | ICD-10-CM | POA: Diagnosis not present

## 2017-12-10 DIAGNOSIS — Z1159 Encounter for screening for other viral diseases: Secondary | ICD-10-CM | POA: Diagnosis not present

## 2018-02-01 DIAGNOSIS — Z23 Encounter for immunization: Secondary | ICD-10-CM | POA: Diagnosis not present

## 2018-03-14 DIAGNOSIS — N183 Chronic kidney disease, stage 3 (moderate): Secondary | ICD-10-CM | POA: Diagnosis not present

## 2018-03-14 DIAGNOSIS — E1122 Type 2 diabetes mellitus with diabetic chronic kidney disease: Secondary | ICD-10-CM | POA: Diagnosis not present

## 2018-03-14 DIAGNOSIS — Z6829 Body mass index (BMI) 29.0-29.9, adult: Secondary | ICD-10-CM | POA: Diagnosis not present

## 2018-09-17 DIAGNOSIS — Z7984 Long term (current) use of oral hypoglycemic drugs: Secondary | ICD-10-CM | POA: Diagnosis not present

## 2018-09-17 DIAGNOSIS — E1122 Type 2 diabetes mellitus with diabetic chronic kidney disease: Secondary | ICD-10-CM | POA: Diagnosis not present

## 2018-09-17 DIAGNOSIS — N183 Chronic kidney disease, stage 3 (moderate): Secondary | ICD-10-CM | POA: Diagnosis not present

## 2018-09-17 DIAGNOSIS — Z23 Encounter for immunization: Secondary | ICD-10-CM | POA: Diagnosis not present

## 2018-11-06 ENCOUNTER — Other Ambulatory Visit: Payer: Self-pay | Admitting: Family Medicine

## 2018-11-06 DIAGNOSIS — Z1231 Encounter for screening mammogram for malignant neoplasm of breast: Secondary | ICD-10-CM

## 2018-11-18 DIAGNOSIS — R350 Frequency of micturition: Secondary | ICD-10-CM | POA: Diagnosis not present

## 2018-12-22 ENCOUNTER — Other Ambulatory Visit: Payer: Self-pay

## 2018-12-22 ENCOUNTER — Ambulatory Visit
Admission: RE | Admit: 2018-12-22 | Discharge: 2018-12-22 | Disposition: A | Discharge status: Home / Self Care | Payer: Medicare Other | Source: Ambulatory Visit | Attending: Family Medicine | Admitting: Family Medicine

## 2018-12-22 DIAGNOSIS — Z1231 Encounter for screening mammogram for malignant neoplasm of breast: Secondary | ICD-10-CM | POA: Diagnosis not present

## 2019-01-30 DIAGNOSIS — Z23 Encounter for immunization: Secondary | ICD-10-CM | POA: Diagnosis not present

## 2019-03-20 DIAGNOSIS — N183 Chronic kidney disease, stage 3 unspecified: Secondary | ICD-10-CM | POA: Diagnosis not present

## 2019-03-20 DIAGNOSIS — E1122 Type 2 diabetes mellitus with diabetic chronic kidney disease: Secondary | ICD-10-CM | POA: Diagnosis not present

## 2019-03-20 DIAGNOSIS — E559 Vitamin D deficiency, unspecified: Secondary | ICD-10-CM | POA: Diagnosis not present

## 2019-05-22 DIAGNOSIS — Z23 Encounter for immunization: Secondary | ICD-10-CM | POA: Diagnosis not present

## 2019-06-20 DIAGNOSIS — Z23 Encounter for immunization: Secondary | ICD-10-CM | POA: Diagnosis not present

## 2019-11-03 ENCOUNTER — Other Ambulatory Visit: Payer: Self-pay

## 2019-11-03 ENCOUNTER — Ambulatory Visit (INDEPENDENT_AMBULATORY_CARE_PROVIDER_SITE_OTHER): Payer: Medicare Other | Admitting: Internal Medicine

## 2019-11-03 ENCOUNTER — Encounter: Payer: Self-pay | Admitting: Internal Medicine

## 2019-11-03 VITALS — BP 134/86 | HR 74 | Ht 65.0 in | Wt 179.8 lb

## 2019-11-03 DIAGNOSIS — R000 Tachycardia, unspecified: Secondary | ICD-10-CM | POA: Diagnosis not present

## 2019-11-03 DIAGNOSIS — N1832 Chronic kidney disease, stage 3b: Secondary | ICD-10-CM | POA: Diagnosis not present

## 2019-11-03 DIAGNOSIS — E119 Type 2 diabetes mellitus without complications: Secondary | ICD-10-CM | POA: Diagnosis not present

## 2019-11-03 DIAGNOSIS — E785 Hyperlipidemia, unspecified: Secondary | ICD-10-CM | POA: Diagnosis not present

## 2019-11-03 MED ORDER — METOPROLOL SUCCINATE ER 25 MG PO TB24: 25.0000 mg | ORAL_TABLET | Freq: Every day | ORAL | 3 refills | Status: DC

## 2019-11-03 NOTE — Patient Instructions (Signed)

## 2019-11-03 NOTE — Progress Notes (Signed)
Cardiology Office Note:    Date:  11/03/2019   ID:  Tamara Pruitt, DOB Nov 12, 1945, MRN [id number]  PCP:  Kathyrn Lass, MD  Cardiologist:  No primary care provider on file.  Electrophysiologist:  None   Referring MD: Kathyrn Lass, MD   Chief Complaint: Supraventricular tachycardia  History of Present Illness:    Tamara Pruitt is a 74 y.o. female with a history of type 2 diabetes, chronic kidney disease stage III, hyperlipidemia who presents for evaluation of supraventricular tachycardia.  Referral suggests query sinus tachycardia versus a reentrant tachycardia.  EKG from Dr. Ammie Ferrier office reviewed dated 10/05/2019.  This demonstrates sinus tachycardia with a rate of 110 beats a minute and left atrial enlargement.  We reviewed this EKG during our office visit.  We reviewed etiologies of sinus tachycardia.  EKG obtained at our office today demonstrates sinus rhythm, left atrial enlargement, and a rate of 74 bpm.  Patient overall feels well.  No significantly bothersome palpitations.  Was started on metoprolol and is tolerating well.  Denies chest pain, shortness of breath, PND, orthopnea, leg swelling, syncope, presyncope.  She is diabetic and continues on Metformin as well as a statin.  Laboratory studies reviewed, normal hemoglobin, platelet count, WBCs.  Normal TSH.  Patient has been fully vaccinated for COVID-19.  Hemoglobin A1c 7.1.  No recent lipid panel to review. Past Medical History:  Diagnosis Date  . Chronic kidney disease    stage 3 CKD  . Diabetes mellitus without complication ([place])   . Discharge from left nipple   . Hyperlipidemia     Past Surgical History:  Procedure Laterality Date  . BREAST BIOPSY Left 10/31/2017   Procedure: LEFT BREAST BIOPSY;  Surgeon: Alphonsa Overall, MD;  Location: [facility];  Service: General;  Laterality: Left;  . CESAREAN SECTION    . COLONOSCOPY    . OVARIAN CYST REMOVAL      Current Medications: Current Meds    Medication Sig  . atorvastatin (LIPITOR) 10 MG tablet Take 10 mg by mouth daily.  . metFORMIN (GLUCOPHAGE) 1000 MG tablet Take 1,000 mg by mouth 2 (two) times daily with a meal.   . metoprolol succinate (TOPROL-XL) 25 MG 24 hr tablet Take 25 mg by mouth daily.  . Vitamin D, Ergocalciferol, (DRISDOL) 50000 units CAPS capsule Take 50,000 Units by mouth every 7 (seven) days.     Allergies:   Patient has no known allergies.   Social History   Socioeconomic History  . Marital status: Widowed    Spouse name: Not on file  . Number of children: Not on file  . Years of education: Not on file  . Highest education level: Not on file  Occupational History  . Not on file  Tobacco Use  . Smoking status: Never Smoker  . Smokeless tobacco: Never Used  Substance and Sexual Activity  . Alcohol use: Never  . Drug use: Never  . Sexual activity: Not on file  Other Topics Concern  . Not on file  Social History Narrative  . Not on file   Social Determinants of Health   Financial Resource Strain:   . Difficulty of Paying Living Expenses:   Food Insecurity:   . Worried About Charity fundraiser in the Last Year:   . Arboriculturist in the Last Year:   Transportation Needs:   . Film/video editor (Medical):   Marland Kitchen Lack of Transportation (Non-Medical):   Physical Activity:   .  Days of Exercise per Week:   . Minutes of Exercise per Session:   Stress:   . Feeling of Stress :   Social Connections:   . Frequency of Communication with Friends and Family:   . Frequency of Social Gatherings with Friends and Family:   . Attends Religious Services:   . Active Member of Clubs or Organizations:   . Attends Archivist Meetings:   Marland Kitchen Marital Status:      Family History: The patient's family history includes Breast cancer in her maternal aunt and paternal aunt; Breast cancer (age of onset: 63) in her mother; Breast cancer (age of onset: 38) in her sister.  ROS:   Please see the history  of present illness.    All other systems reviewed and are negative.  EKGs/Labs/Other Studies Reviewed:    The following studies were reviewed today:  EKG:  NSR, LAE  Recent Labs: No results found for requested labs within last 8760 hours.  Recent Lipid Panel No results found for: CHOL, TRIG, HDL, CHOLHDL, VLDL, LDLCALC, LDLDIRECT  Physical Exam:    VS:  BP (!) 134/86   Pulse 74   Ht 5\' 5"  (1.651 m)   Wt 179 lb 12.8 oz (81.6 kg)   SpO2 99%   BMI 29.92 kg/m     Wt Readings from Last 5 Encounters:  11/03/19 179 lb 12.8 oz (81.6 kg)  10/31/17 176 lb (79.8 kg)     Constitutional: No acute distress Eyes: sclera non-icteric, normal conjunctiva and lids ENMT: normal dentition, moist mucous membranes Cardiovascular: regular rhythm, normal rate, no murmurs. S1 and S2 normal. Radial pulses normal bilaterally. No jugular venous distention.  Respiratory: clear to auscultation bilaterally GI : normal bowel sounds, soft and nontender. No distention.   MSK: extremities warm, well perfused. No edema.  NEURO: grossly nonfocal exam, moves all extremities. PSYCH: alert and oriented x 3, normal mood and affect.   ASSESSMENT:    1. Sinus tachycardia   2. Hyperlipidemia, unspecified hyperlipidemia type   3. Type 2 diabetes mellitus without complication, without long-term current use of insulin (HCC)   4. Stage 3b chronic kidney disease    PLAN:    Sinus tachycardia - Plan: EKG 12-Lead We participated in shared decision-making.  I have reviewed EKG from primary care doctor's office as well as our EKG today.  No definite features of a supra ventricular tachycardia or reentrant tachycardia at this time.  If patient has bothersome palpitations, I have offered a cardiac monitor.  In addition due to findings of left atrial enlargement on EKG, I have offered an echocardiogram.  At this time we will defer testing, and will observe symptoms.  I plan to follow-up with the patient in 6 months or  sooner as needed.  Okay to continue metoprolol, however sinus tachycardia does not require rate control and beta-blockade can be taken for symptoms or patient preference.  Hyperlipidemia, unspecified hyperlipidemia type-continue atorvastatin 10 mg daily.  Type 2 diabetes mellitus without complication, without long-term current use of insulin (HCC)-per PCP on Metformin and statin.  Stage 3b chronic kidney disease-monitored by PCP  Total time of encounter: 45 minutes total time of encounter, including 25 minutes spent in face-to-face patient care on the date of this encounter. This time includes coordination of care and counseling regarding above mentioned problem list. Remainder of non-face-to-face time involved reviewing chart documents/testing relevant to the patient encounter and documentation in the medical record. I have independently reviewed documentation from referring provider.  Approximately 25 pages of outside medical records reviewed in conjunction with this consultation.  Cherlynn Kaiser, MD   [facility]    Medication Adjustments/Labs and Tests Ordered: Current medicines are reviewed at length with the patient today.  Concerns regarding medicines are outlined above.  Orders Placed This Encounter  Procedures  . EKG 12-Lead   Meds ordered this encounter  Medications  . metoprolol succinate (TOPROL-XL) 25 MG 24 hr tablet    Sig: Take 1 tablet (25 mg total) by mouth daily.    Dispense:  90 tablet    Refill:  3    Patient Instructions  Medication Instructions:  Your Physician recommend you continue on your current medication as directed.    *If you need a refill on your cardiac medications before your next appointment, please call your pharmacy*   Lab Work: None   Testing/Procedures: None   Follow-Up: At [facility], you and your health needs are our priority.  As part of our continuing mission to provide you with exceptional heart care, we have  created designated Provider Care Teams.  These Care Teams include your primary Cardiologist (physician) and Advanced Practice Providers (APPs -  Physician Assistants and Nurse Practitioners) who all work together to provide you with the care you need, when you need it.  We recommend signing up for the patient portal called "MyChart".  Sign up information is provided on this After Visit Summary.  MyChart is used to connect with patients for Virtual Visits (Telemedicine).  Patients are able to view lab/test results, encounter notes, upcoming appointments, etc.  Non-urgent messages can be sent to your provider as well.   To learn more about what you can do with MyChart, go to [email or web address].    Your next appointment:   6 month(s)  The format for your next appointment:   In Person  Provider:   Buford Dresser, MD

## 2020-01-04 ENCOUNTER — Other Ambulatory Visit: Payer: Self-pay | Admitting: Family Medicine

## 2020-01-04 DIAGNOSIS — Z1231 Encounter for screening mammogram for malignant neoplasm of breast: Secondary | ICD-10-CM

## 2020-01-26 ENCOUNTER — Other Ambulatory Visit: Payer: Self-pay

## 2020-01-26 ENCOUNTER — Ambulatory Visit
Admission: RE | Admit: 2020-01-26 | Discharge: 2020-01-26 | Disposition: A | Discharge status: Home / Self Care | Payer: Medicare Other | Source: Ambulatory Visit | Attending: Family Medicine | Admitting: Family Medicine

## 2020-01-26 DIAGNOSIS — Z1231 Encounter for screening mammogram for malignant neoplasm of breast: Secondary | ICD-10-CM | POA: Diagnosis not present

## 2020-02-10 ENCOUNTER — Telehealth (INDEPENDENT_AMBULATORY_CARE_PROVIDER_SITE_OTHER): Payer: Medicare Other | Admitting: Internal Medicine

## 2020-02-10 ENCOUNTER — Encounter: Payer: Self-pay | Admitting: Internal Medicine

## 2020-02-10 VITALS — BP 150/79 | HR 77 | Ht 65.0 in | Wt 178.0 lb

## 2020-02-10 DIAGNOSIS — R072 Precordial pain: Secondary | ICD-10-CM

## 2020-02-10 DIAGNOSIS — Z01812 Encounter for preprocedural laboratory examination: Secondary | ICD-10-CM | POA: Diagnosis not present

## 2020-02-10 DIAGNOSIS — I10 Essential (primary) hypertension: Secondary | ICD-10-CM

## 2020-02-10 DIAGNOSIS — Z79899 Other long term (current) drug therapy: Secondary | ICD-10-CM

## 2020-02-10 DIAGNOSIS — R931 Abnormal findings on diagnostic imaging of heart and coronary circulation: Secondary | ICD-10-CM

## 2020-02-10 MED ORDER — LOSARTAN POTASSIUM 25 MG PO TABS
25.0000 mg | ORAL_TABLET | Freq: Every day | ORAL | 3 refills | Status: DC
Start: 2020-02-10 — End: 2020-03-18

## 2020-02-10 MED ORDER — METOPROLOL TARTRATE 50 MG PO TABS: 50.0000 mg | ORAL_TABLET | Freq: Once | ORAL | 0 refills | Status: DC

## 2020-02-10 NOTE — Patient Instructions (Addendum)
Medication Instructions:  PLEASE START LOSARTAN 42m (1 Tablet) DAILY *If you need a refill on your cardiac medications before your next appointment, please call your pharmacy*  Lab Work: PLEASE COME INTO OFFICE 11/9 for BLOOD WORK.  BMET 1 WEEK PRIOR TO CTA SCAN  If you have labs (blood work) drawn today and your tests are completely normal, you will receive your results only by: .Marland KitchenMyChart Message (if you have MyChart) OR . A paper copy in the mail If you have any lab test that is abnormal or we need to change your treatment, we will call you to review the results.  Testing/Procedures: Your physician has requested that you have cardiac CT. Cardiac computed tomography (CT) is a painless test that uses an x-ray machine to take clear, detailed pictures of your heart. For further information please visit [email or web address] Please follow instruction sheet as given.  Follow-Up: [facility] you and your health needs are our priority.  As part of our continuing mission to provide you with exceptional heart care, we have created designated Provider Care Teams.  These Care Teams include your primary Cardiologist (physician) and Advanced Practice Providers (APPs -  Physician Assistants and Nurse Practitioners) who all work together to provide you with the care you need, when you need it.  Your next appointment:   DECEMBER 10th 9:40am   The format for your next appointment:   Virtual Visit   Provider:   GCherlynn Kaiser MD  Other Instructions Your cardiac CT will be scheduled at one of the below locations:   [facility] [phone] [facility] minutes prior to test start time. [facility] (first floor) to check-in and test prep.  Please follow these instructions carefully (unless otherwise  directed):  On the Night Before the Test: . Be sure to Drink plenty of water. . Do not consume any caffeinated/decaffeinated beverages or chocolate 12 hours prior to your test. . Do not take any antihistamines 12 hours prior to your test.  On the Day of the Test: . Drink plenty of water. Do not drink any water within one hour of the test. . Do not eat any food 4 hours prior to the test. . You may take your regular medications prior to the test.  . Take metoprolol (Lopressor) two hours prior to test. . HOLD Furosemide/Hydrochlorothiazide morning of the test. . FEMALES- please wear underwire-free bra if available  After the Test: . Drink plenty of water. . After receiving IV contrast, you may experience a mild flushed feeling. This is normal. . On occasion, you may experience a mild rash up to 24 hours after the test. This is not dangerous. If this occurs, you can take Benadryl 25 mg and increase your fluid intake. . If you experience trouble breathing, this can be serious. If it is severe call 911 IMMEDIATELY. If it is mild, please call our office. . If you take any of these medications: Glipizide/Metformin, Avandament, Glucavance, please do not take 48 hours after completing test unless otherwise instructed.  Once we have confirmed authorization from your insurance company, we will call you to set up a date and time for your test. Based on how quickly your insurance processes prior authorizations requests, please allow up to 4 weeks to be contacted for scheduling your Cardiac CT appointment. Be advised  that routine Cardiac CT appointments could be scheduled as many as 8 weeks after your provider has ordered it.  For non-scheduling related questions, please contact the cardiac imaging nurse navigator should you have any questions/concerns: Marchia Bond, Cardiac Imaging Nurse Navigator Burley Saver, Interim Cardiac Imaging Nurse [place] and [facility] Services Direct Office  Dial: [phone]   For scheduling needs, including cancellations and rescheduling, please call Vivien Rota at [phone], option 3.

## 2020-02-10 NOTE — Progress Notes (Signed)
Virtual Visit via Telephone Note   This visit type was conducted due to national recommendations for restrictions regarding the COVID-19 Pandemic (e.g. social distancing) in an effort to limit this patient's exposure and mitigate transmission in our community.  Due to her co-morbid illnesses, this patient is at least at moderate risk for complications without adequate follow up.  This format is felt to be most appropriate for this patient at this time.  The patient did not have access to video technology/had technical difficulties with video requiring transitioning to audio format only (telephone).  All issues noted in this document were discussed and addressed.  No physical exam could be performed with this format.  Please refer to the patient's chart for her  consent to telehealth for [facility].    Date:  02/10/2020   ID:  Rafael Bihari, DOB 05-10-1945, MRN [id number] The patient was identified using 2 identifiers.  Patient Location: Home Provider Location: Home Office  PCP:  Kathyrn Lass, MD  Cardiologist:  No primary care provider on file.  Electrophysiologist:  None   Evaluation Performed:  Follow-Up Visit  Chief Complaint:  Chest pain  History of Present Illness:    Tamara Pruitt is a 74 y.o. female with type 2 diabetes, chronic kidney disease stage III, hyperlipidemia who presents for evaluation of supraventricular tachycardia. Referral suggests query sinus tachycardia versus a reentrant tachycardia.   On metoprolol, she feels better. No palpitations currently and palpitations do not seem to occur with chest discomfort. Sinus tachycardia has improved and no more HR >100.  BP in 140s and 150s. Feels BP has been creeping up over time. Elevated BP does not seem to coincide with chest pain.   Chest pain over past 6 weeks, on left side, seconds at at time. Happening daily. Not indigestion. Sometimes feels like strained muscle. 10 mo old granddaughter, but no pain with lifting  her.   Diarrhea with metformin.   The patient does not have symptoms concerning for COVID-19 infection (fever, chills, cough, or new shortness of breath).    Past Medical History:  Diagnosis Date  . Chronic kidney disease    stage 3 CKD  . Diabetes mellitus without complication ([place])   . Discharge from left nipple   . Hyperlipidemia    Past Surgical History:  Procedure Laterality Date  . BREAST BIOPSY Left 10/31/2017   Procedure: LEFT BREAST BIOPSY;  Surgeon: Alphonsa Overall, MD;  Location: [place];  Service: General;  Laterality: Left;  . CESAREAN SECTION    . COLONOSCOPY    . OVARIAN CYST REMOVAL       Current Meds  Medication Sig  . atorvastatin (LIPITOR) 10 MG tablet Take 10 mg by mouth daily.  . metFORMIN (GLUCOPHAGE) 1000 MG tablet Take 1,000 mg by mouth 2 (two) times daily with a meal.   . metoprolol succinate (TOPROL-XL) 25 MG 24 hr tablet Take 1 tablet (25 mg total) by mouth daily.  . Vitamin D, Ergocalciferol, (DRISDOL) 50000 units CAPS capsule Take 50,000 Units by mouth every 7 (seven) days.     Allergies:   Patient has no known allergies.   Social History   Tobacco Use  . Smoking status: Never Smoker  . Smokeless tobacco: Never Used  Substance Use Topics  . Alcohol use: Never  . Drug use: Never     Family Hx: The patient's family history includes Breast cancer in her maternal aunt and paternal aunt; Breast cancer (age of onset: 27) in  her mother; Breast cancer (age of onset: 37) in her sister.  ROS:   Please see the history of present illness.     All other systems reviewed and are negative.   Prior CV studies:   The following studies were reviewed today:    Labs/Other Tests and Data Reviewed:    EKG:  An ECG dated 11/03/19 was personally reviewed today and demonstrated:  sinus rhythm  Recent Labs: No results found for requested labs within last 8760 hours.   Recent Lipid Panel No results found for: CHOL, TRIG, HDL, CHOLHDL,  LDLCALC, LDLDIRECT  Wt Readings from Last 3 Encounters:  02/10/20 178 lb (80.7 kg)  11/03/19 179 lb 12.8 oz (81.6 kg)  10/31/17 176 lb (79.8 kg)     Risk Assessment/Calculations:      Objective:    Vital Signs:  BP (!) 150/79   Pulse 77   Ht 5\' 5"  (1.651 m)   Wt 178 lb (80.7 kg)   BMI 29.62 kg/m    VITAL SIGNS:  reviewed GEN:  no acute distress RESPIRATORY:  normal respiratory effort, no increased work of breathing NEURO:  alert and oriented x 3, speech normal PSYCH:  normal affect   ASSESSMENT & PLAN:    1. Precordial pain   2. Medication management   3. HTN      She is having chest pain for 6 weeks, with a history of DM, HTN and HLD, she has risk factors for CAD. We discussed various stress modalities and agreed that with a history of diabetes, CCTA would be the next best step. HR has improved on metoprolol, will give additional metoprolol pre CCTA.   BP is elevated today, and has been more recently. I would recommend starting ACE-I or ARB in setting of CKD and DM for HTN. She prefers side effect profile of losartan vs lisinopril so we will start losartan 25 mg daily. She will need BMP, arrange for 11/9.   Will follow up in 1 mo.   COVID-19 Education: The signs and symptoms of COVID-19 were discussed with the patient and how to seek care for testing (follow up with PCP or arrange E-visit).  The importance of social distancing was discussed today.  Time:   Today, I have spent 28 minutes with the patient with telehealth technology discussing the above problems.     Medication Adjustments/Labs and Tests Ordered: Current medicines are reviewed at length with the patient today.  Concerns regarding medicines are outlined above.   Tests Ordered: No orders of the defined types were placed in this encounter.   Medication Changes: No orders of the defined types were placed in this encounter.   Follow Up:  Virtual Visit  in 1 month(s)  Signed, Elouise Munroe, MD   02/10/2020 8:19 AM    [place]

## 2020-02-16 ENCOUNTER — Other Ambulatory Visit: Payer: Self-pay

## 2020-02-16 DIAGNOSIS — Z79899 Other long term (current) drug therapy: Secondary | ICD-10-CM

## 2020-02-16 DIAGNOSIS — Z01812 Encounter for preprocedural laboratory examination: Secondary | ICD-10-CM

## 2020-02-16 DIAGNOSIS — R072 Precordial pain: Secondary | ICD-10-CM

## 2020-02-17 LAB — BASIC METABOLIC PANEL
BUN/Creatinine Ratio: 24 (ref 12–28)
BUN: 28 mg/dL — ABNORMAL HIGH (ref 8–27)
CO2: 22 mmol/L (ref 20–29)
Calcium: 9.5 mg/dL (ref 8.7–10.3)
Chloride: 102 mmol/L (ref 96–106)
Creatinine, Ser: 1.16 mg/dL — ABNORMAL HIGH (ref 0.57–1.00)
GFR calc Af Amer: 54 mL/min/{1.73_m2} — ABNORMAL LOW (ref >59)
GFR calc non Af Amer: 47 mL/min/{1.73_m2} — ABNORMAL LOW (ref >59)
Glucose: 119 mg/dL — ABNORMAL HIGH (ref 65–99)
Potassium: 4.6 mmol/L (ref 3.5–5.2)
Sodium: 137 mmol/L (ref 134–144)

## 2020-02-22 ENCOUNTER — Telehealth (HOSPITAL_COMMUNITY): Payer: Self-pay | Admitting: Emergency Medicine

## 2020-02-22 NOTE — Telephone Encounter (Signed)
Attempted to call patient regarding upcoming cardiac CT appointment. °Left message on voicemail with name and callback number °Aizza Santiago RN Navigator Cardiac Imaging °[facility] °[phone] Office °[phone] Cell ° °

## 2020-02-22 NOTE — Telephone Encounter (Signed)
Reaching out to patient to offer assistance regarding upcoming cardiac imaging study; pt verbalizes understanding of appt date/time, parking situation and where to check in, pre-test NPO status and medications ordered, and verified current allergies; name and call back number provided for further questions should they arise Tamara Bond RN Navigator Cardiac Imaging [place] [facility] [phone] office [phone] cell   Pt verbalized understanding to take 50mg  metop 2 hr prior to scan

## 2020-02-23 ENCOUNTER — Encounter (HOSPITAL_COMMUNITY): Payer: Medicare Other

## 2020-02-23 ENCOUNTER — Other Ambulatory Visit: Payer: Self-pay

## 2020-02-23 ENCOUNTER — Ambulatory Visit (HOSPITAL_COMMUNITY)
Admission: RE | Admit: 2020-02-23 | Discharge: 2020-02-23 | Disposition: A | Discharge status: Home / Self Care | Payer: Medicare Other | Source: Ambulatory Visit | Attending: Internal Medicine | Admitting: Internal Medicine

## 2020-02-23 ENCOUNTER — Encounter (HOSPITAL_COMMUNITY): Payer: Self-pay

## 2020-02-23 DIAGNOSIS — R072 Precordial pain: Secondary | ICD-10-CM | POA: Insufficient documentation

## 2020-02-23 MED ORDER — NITROGLYCERIN 0.4 MG SL SUBL
SUBLINGUAL_TABLET | SUBLINGUAL | Status: AC
  Filled 2020-02-23: qty 2

## 2020-02-23 MED ORDER — NITROGLYCERIN 0.4 MG SL SUBL
0.8000 mg | SUBLINGUAL_TABLET | Freq: Once | SUBLINGUAL | Status: AC
  Administered 2020-02-23: 0.8 mg via SUBLINGUAL

## 2020-02-23 MED ORDER — IOHEXOL 350 MG/ML SOLN
80.0000 mL | Freq: Once | INTRAVENOUS | Status: AC | PRN
  Administered 2020-02-23: 80 mL via INTRAVENOUS

## 2020-03-18 ENCOUNTER — Encounter: Payer: Self-pay | Admitting: Internal Medicine

## 2020-03-18 ENCOUNTER — Telehealth (INDEPENDENT_AMBULATORY_CARE_PROVIDER_SITE_OTHER): Payer: Medicare Other | Admitting: Internal Medicine

## 2020-03-18 VITALS — Ht 65.0 in | Wt 178.0 lb

## 2020-03-18 DIAGNOSIS — E785 Hyperlipidemia, unspecified: Secondary | ICD-10-CM

## 2020-03-18 DIAGNOSIS — Z79899 Other long term (current) drug therapy: Secondary | ICD-10-CM | POA: Diagnosis not present

## 2020-03-18 DIAGNOSIS — R072 Precordial pain: Secondary | ICD-10-CM | POA: Diagnosis not present

## 2020-03-18 DIAGNOSIS — I10 Essential (primary) hypertension: Secondary | ICD-10-CM

## 2020-03-18 MED ORDER — LOSARTAN POTASSIUM 50 MG PO TABS
50.0000 mg | ORAL_TABLET | Freq: Every day | ORAL | 3 refills | Status: DC
Start: 2020-03-18 — End: 2020-05-03

## 2020-03-18 NOTE — Progress Notes (Signed)
Virtual Visit via Telephone Note   This visit type was conducted due to national recommendations for restrictions regarding the COVID-19 Pandemic (e.g. social distancing) in an effort to limit this patient's exposure and mitigate transmission in our community.  Due to her co-morbid illnesses, this patient is at least at moderate risk for complications without adequate follow up.  This format is felt to be most appropriate for this patient at this time.  The patient did not have access to video technology/had technical difficulties with video requiring transitioning to audio format only (telephone).  All issues noted in this document were discussed and addressed.  No physical exam could be performed with this format.  Please refer to the patient's chart for her  consent to telehealth for [facility].    Date:  03/18/2020   ID:  Tamara Pruitt, DOB [birth year], MRN [id number] The patient was identified using 2 identifiers.  Patient Location: Home Provider Location: Home Office  PCP:  Kathyrn Lass, MD  Cardiologist:  No primary care provider on file.  Electrophysiologist:  None   Evaluation Performed:  Follow-Up Visit  Chief Complaint:  Chest pain  History of Present Illness:    Tamara Pruitt is a 74 y.o. female with type 2 diabetes, chronic kidney disease stage III, hyperlipidemia who presents for evaluation of supraventricular tachycardia. Referral suggests query sinus tachycardia versus a reentrant tachycardia.    On metoprolol, she feels better. No palpitations currently and palpitations do not seem to occur with chest discomfort. Sinus tachycardia has improved and no more HR >100.  We performed a CCTA which we reviewed today showing minimal CAD and aortic atherosclerosis. She still has some twinges of chest pain.   BP still elevated, we discussed in detail. Will increase losartan to 50 mg daily.   The patient does not have symptoms concerning for COVID-19 infection (fever,  chills, cough, or new shortness of breath).    Past Medical History:  Diagnosis Date  . Chronic kidney disease    stage 3 CKD  . Diabetes mellitus without complication ([place])   . Discharge from left nipple   . Hyperlipidemia    Past Surgical History:  Procedure Laterality Date  . BREAST BIOPSY Left 10/31/2017   Procedure: LEFT BREAST BIOPSY;  Surgeon: Alphonsa Overall, MD;  Location: [place];  Service: General;  Laterality: Left;  . CESAREAN SECTION    . COLONOSCOPY    . OVARIAN CYST REMOVAL       Current Meds  Medication Sig  . atorvastatin (LIPITOR) 10 MG tablet Take 10 mg by mouth daily.  Marland Kitchen losartan (COZAAR) 25 MG tablet Take 1 tablet (25 mg total) by mouth daily.  . metFORMIN (GLUCOPHAGE) 1000 MG tablet Take 1,000 mg by mouth 2 (two) times daily with a meal.   . metoprolol succinate (TOPROL-XL) 25 MG 24 hr tablet Take 1 tablet (25 mg total) by mouth daily.  . Vitamin D, Ergocalciferol, (DRISDOL) 50000 units CAPS capsule Take 50,000 Units by mouth every 7 (seven) days.     Allergies:   Patient has no known allergies.   Social History   Tobacco Use  . Smoking status: Never Smoker  . Smokeless tobacco: Never Used  Substance Use Topics  . Alcohol use: Never  . Drug use: Never     Family Hx: The patient's family history includes Breast cancer in her maternal aunt and paternal aunt; Breast cancer (age of onset: 92) in her mother; Breast cancer (age of onset:  62) in her sister.  ROS:   Please see the history of present illness.     All other systems reviewed and are negative.   Prior CV studies:   The following studies were reviewed today:    Labs/Other Tests and Data Reviewed:    EKG:  No ECG reviewed.  Recent Labs: 02/16/2020: BUN 28; Creatinine, Ser 1.16; Potassium 4.6; Sodium 137   Recent Lipid Panel No results found for: CHOL, TRIG, HDL, CHOLHDL, LDLCALC, LDLDIRECT  Wt Readings from Last 3 Encounters:  03/18/20 178 lb (80.7 kg)   02/10/20 178 lb (80.7 kg)  11/03/19 179 lb 12.8 oz (81.6 kg)     Risk Assessment/Calculations:     Objective:    Vital Signs:  Ht 5\' 5"  (1.651 m)   Wt 178 lb (80.7 kg)   BMI 29.62 kg/m    VITAL SIGNS:  reviewed GEN:  no acute distress RESPIRATORY:  normal respiratory effort, no increased work of breathing NEURO:  alert and oriented x 3, speech normal PSYCH:  normal affect   ASSESSMENT & PLAN:    1. Precordial pain   2. Essential hypertension   3. Hyperlipidemia, unspecified hyperlipidemia type   4. Medication management    Chest pain - continue to monitor, will increase antihypertensive therapy to see if this improves symptoms. Minimal CAD on CCTA.  HTN - increase losartan to 50 mg daily. Continue metoprolol  HLD - continue atorvastatin 10 mg daily.   COVID-19 Education: The signs and symptoms of COVID-19 were discussed with the patient and how to seek care for testing (follow up with PCP or arrange E-visit).  The importance of social distancing was discussed today.  Time:   Today, I have spent 20 minutes with the patient with telehealth technology discussing the above problems.     Medication Adjustments/Labs and Tests Ordered: Current medicines are reviewed at length with the patient today.  Concerns regarding medicines are outlined above.   Patient Instructions  Medication Instructions:  INCREASE LOSARTAN TO 50mg  DAILY-- THIS WILL BE (1) 50mg  TABLET DAILY  *If you need a refill on your cardiac medications before your next appointment, please call your pharmacy*  Lab Work: None Ordered At This Time.  If you have labs (blood work) drawn today and your tests are completely normal, you will receive your results only by: Marland Kitchen MyChart Message (if you have MyChart) OR . A paper copy in the mail If you have any lab test that is abnormal or we need to change your treatment, we will call you to review the results.  Follow-Up: At [facility], you and your health  needs are our priority.  As part of our continuing mission to provide you with exceptional heart care, we have created designated Provider Care Teams.  These Care Teams include your primary Cardiologist (physician) and Advanced Practice Providers (APPs -  Physician Assistants and Nurse Practitioners) who all work together to provide you with the care you need, when you need it.  Your next appointment:   KEEP APPOINTMENT AS SCHEDULED   The format for your next appointment:   In Person  Provider:   Cherlynn Kaiser, MD     Signed, Elouise Munroe, MD  03/18/2020 9:44 AM    [place]

## 2020-03-18 NOTE — Patient Instructions (Signed)
Medication Instructions:  INCREASE LOSARTAN TO 50mg  DAILY-- THIS WILL BE (1) 50mg  TABLET DAILY  *If you need a refill on your cardiac medications before your next appointment, please call your pharmacy*  Lab Work: None Ordered At This Time.  If you have labs (blood work) drawn today and your tests are completely normal, you will receive your results only by: Marland Kitchen MyChart Message (if you have MyChart) OR . A paper copy in the mail If you have any lab test that is abnormal or we need to change your treatment, we will call you to review the results.  Follow-Up: At [facility], you and your health needs are our priority.  As part of our continuing mission to provide you with exceptional heart care, we have created designated Provider Care Teams.  These Care Teams include your primary Cardiologist (physician) and Advanced Practice Providers (APPs -  Physician Assistants and Nurse Practitioners) who all work together to provide you with the care you need, when you need it.  Your next appointment:   KEEP APPOINTMENT AS SCHEDULED   The format for your next appointment:   In Person  Provider:   Cherlynn Kaiser, MD

## 2020-04-26 DIAGNOSIS — I120 Hypertensive chronic kidney disease with stage 5 chronic kidney disease or end stage renal disease: Secondary | ICD-10-CM | POA: Diagnosis not present

## 2020-04-26 DIAGNOSIS — Z6829 Body mass index (BMI) 29.0-29.9, adult: Secondary | ICD-10-CM | POA: Diagnosis not present

## 2020-04-26 DIAGNOSIS — E1122 Type 2 diabetes mellitus with diabetic chronic kidney disease: Secondary | ICD-10-CM | POA: Diagnosis not present

## 2020-04-26 DIAGNOSIS — N183 Chronic kidney disease, stage 3 unspecified: Secondary | ICD-10-CM | POA: Diagnosis not present

## 2020-04-26 DIAGNOSIS — E7800 Pure hypercholesterolemia, unspecified: Secondary | ICD-10-CM | POA: Diagnosis not present

## 2020-04-26 DIAGNOSIS — E559 Vitamin D deficiency, unspecified: Secondary | ICD-10-CM | POA: Diagnosis not present

## 2020-04-26 DIAGNOSIS — Z79899 Other long term (current) drug therapy: Secondary | ICD-10-CM | POA: Diagnosis not present

## 2020-05-03 ENCOUNTER — Other Ambulatory Visit: Payer: Self-pay

## 2020-05-03 ENCOUNTER — Ambulatory Visit (INDEPENDENT_AMBULATORY_CARE_PROVIDER_SITE_OTHER): Payer: Medicare Other | Admitting: Internal Medicine

## 2020-05-03 ENCOUNTER — Encounter: Payer: Self-pay | Admitting: Internal Medicine

## 2020-05-03 VITALS — BP 120/64 | HR 94 | Ht 65.0 in | Wt 179.0 lb

## 2020-05-03 DIAGNOSIS — E785 Hyperlipidemia, unspecified: Secondary | ICD-10-CM

## 2020-05-03 DIAGNOSIS — I10 Essential (primary) hypertension: Secondary | ICD-10-CM | POA: Diagnosis not present

## 2020-05-03 DIAGNOSIS — Z79899 Other long term (current) drug therapy: Secondary | ICD-10-CM

## 2020-05-03 DIAGNOSIS — R072 Precordial pain: Secondary | ICD-10-CM | POA: Diagnosis not present

## 2020-05-03 MED ORDER — LOSARTAN POTASSIUM 50 MG PO TABS
50.0000 mg | ORAL_TABLET | Freq: Every day | ORAL | 3 refills | Status: DC
Start: 2020-05-03 — End: 2024-01-09

## 2020-05-03 NOTE — Progress Notes (Signed)
Cardiology Office Note:    Date:  05/03/2020   ID:  Tamara Pruitt, DOB 05/08/1945, MRN [id number]  PCP:  Kathyrn Lass, MD  Cardiologist:  No primary care provider on file.  Electrophysiologist:  None   Referring MD: Kathyrn Lass, MD   Chief Complaint/Reason for Referral: Follow up chest pain, HTN  History of Present Illness:    Tamara Pruitt is a 75 y.o. female with a history of type 2 diabetes, chronic kidney disease stage III, hyperlipidemia, supraventricular tachycardia, HTN and chest pain. Presents for follow up after increasing dose of losartan.   Twinges in chest cleared up - thinks it might have been a strained muscle. At rest or in bed.   BP runs high at home 130-150 mmHg but better at doctor. Normal here today. Doing well overall on the metoprolol and the losartan increased dose.   Past Medical History:  Diagnosis Date  . Chronic kidney disease    stage 3 CKD  . Diabetes mellitus without complication ([place])   . Discharge from left nipple   . Hyperlipidemia     Past Surgical History:  Procedure Laterality Date  . BREAST BIOPSY Left 10/31/2017   Procedure: LEFT BREAST BIOPSY;  Surgeon: Alphonsa Overall, MD;  Location: [place];  Service: General;  Laterality: Left;  . CESAREAN SECTION    . COLONOSCOPY    . OVARIAN CYST REMOVAL      Current Medications: Current Meds  Medication Sig  . atorvastatin (LIPITOR) 10 MG tablet Take 10 mg by mouth daily.  . Cyanocobalamin (VITAMIN B 12) 500 MCG TABS Take 500 mcg by mouth daily.  Marland Kitchen losartan (COZAAR) 50 MG tablet Take 1 tablet (50 mg total) by mouth daily.  . metFORMIN (GLUCOPHAGE) 500 MG tablet Take 500 mg by mouth daily. Patient takes 4-5 tablets daily  . metoprolol succinate (TOPROL-XL) 25 MG 24 hr tablet Take 1 tablet (25 mg total) by mouth daily.  . Vitamin D, Ergocalciferol, (DRISDOL) 50000 units CAPS capsule Take 50,000 Units by mouth every 7 (seven) days.  . [DISCONTINUED] metFORMIN (GLUCOPHAGE)  1000 MG tablet Take 1,000 mg by mouth 2 (two) times daily with a meal.      Allergies:   Patient has no known allergies.   Social History   Tobacco Use  . Smoking status: Never Smoker  . Smokeless tobacco: Never Used  Substance Use Topics  . Alcohol use: Never  . Drug use: Never     Family History: The patient's family history includes Breast cancer in her maternal aunt and paternal aunt; Breast cancer (age of onset: 41) in her mother; Breast cancer (age of onset: 55) in her sister.  ROS:   Please see the history of present illness.    All other systems reviewed and are negative.  EKGs/Labs/Other Studies Reviewed:    The following studies were reviewed today:  EKG:  NSR, possible LAE   Recent Labs: 02/16/2020: BUN 28; Creatinine, Ser 1.16; Potassium 4.6; Sodium 137  Recent Lipid Panel No results found for: CHOL, TRIG, HDL, CHOLHDL, VLDL, LDLCALC, LDLDIRECT  Physical Exam:    VS:  BP 120/64 (BP Location: Left Arm, Patient Position: Sitting)   Pulse 94   Ht 5\' 5"  (1.651 m)   Wt 179 lb (81.2 kg)   SpO2 96%   BMI 29.79 kg/m     Wt Readings from Last 5 Encounters:  05/03/20 179 lb (81.2 kg)  03/18/20 178 lb (80.7 kg)  02/10/20 178 lb (80.7  kg)  11/03/19 179 lb 12.8 oz (81.6 kg)  10/31/17 176 lb (79.8 kg)    Constitutional: No acute distress Eyes: sclera non-icteric, normal conjunctiva and lids ENMT: normal dentition, moist mucous membranes Cardiovascular: regular rhythm, normal rate, no murmurs. S1 and S2 normal. Radial pulses normal bilaterally. No jugular venous distention.  Respiratory: clear to auscultation bilaterally GI : normal bowel sounds, soft and nontender. No distention.   MSK: extremities warm, well perfused. No edema.  NEURO: grossly nonfocal exam, moves all extremities. PSYCH: alert and oriented x 3, normal mood and affect.   ASSESSMENT:    1. Essential hypertension   2. Precordial pain   3. Hyperlipidemia, unspecified hyperlipidemia type    4. Medication management    PLAN:    Essential hypertension - Plan: EKG 12-Lead Continue metoprolol and   Precordial pain - she thinks it's a strained muscle. Minimal CAD on CCTA recently.   Hyperlipidemia, unspecified hyperlipidemia type - continue atorvastatin 10 mg daily. Lipids optimized.  Medication management Discussed adding aspirin 81 mg daily, deferred by patient at this time. Continue current meds.  Total time of encounter: 30 minutes total time of encounter, including 20 minutes spent in face-to-face patient care on the date of this encounter. This time includes coordination of care and counseling regarding above mentioned problem list. Remainder of non-face-to-face time involved reviewing chart documents/testing relevant to the patient encounter and documentation in the medical record. I have independently reviewed documentation from referring provider.   Cherlynn Kaiser, MD [place]  [facility]    Medication Adjustments/Labs and Tests Ordered: Current medicines are reviewed at length with the patient today.  Concerns regarding medicines are outlined above.   Orders Placed This Encounter  Procedures  . EKG 12-Lead         Meds ordered this encounter  Medications  . losartan (COZAAR) 50 MG tablet    Sig: Take 1 tablet (50 mg total) by mouth daily.    Dispense:  90 tablet    Refill:  3    Patient Instructions  Medication Instructions:  No Changes In Medications at this time.  *If you need a refill on your cardiac medications before your next appointment, please call your pharmacy*  Follow-Up: At [facility], you and your health needs are our priority.  As part of our continuing mission to provide you with exceptional heart care, we have created designated Provider Care Teams.  These Care Teams include your primary Cardiologist (physician) and Advanced Practice Providers (APPs -  Physician Assistants and Nurse Practitioners) who all work together  to provide you with the care you need, when you need it.  Your next appointment:   6 month(s)  The format for your next appointment:   In Person  Provider:   Cherlynn Kaiser, MD

## 2020-05-03 NOTE — Patient Instructions (Signed)

## 2020-08-23 DIAGNOSIS — M545 Low back pain, unspecified: Secondary | ICD-10-CM | POA: Diagnosis not present

## 2020-08-23 DIAGNOSIS — R110 Nausea: Secondary | ICD-10-CM | POA: Diagnosis not present

## 2020-08-23 DIAGNOSIS — N183 Chronic kidney disease, stage 3 unspecified: Secondary | ICD-10-CM | POA: Diagnosis not present

## 2020-10-25 DIAGNOSIS — N183 Chronic kidney disease, stage 3 unspecified: Secondary | ICD-10-CM | POA: Diagnosis not present

## 2020-10-25 DIAGNOSIS — E538 Deficiency of other specified B group vitamins: Secondary | ICD-10-CM | POA: Diagnosis not present

## 2020-10-25 DIAGNOSIS — E7800 Pure hypercholesterolemia, unspecified: Secondary | ICD-10-CM | POA: Diagnosis not present

## 2020-10-25 DIAGNOSIS — E1122 Type 2 diabetes mellitus with diabetic chronic kidney disease: Secondary | ICD-10-CM | POA: Diagnosis not present

## 2020-10-25 DIAGNOSIS — Z7984 Long term (current) use of oral hypoglycemic drugs: Secondary | ICD-10-CM | POA: Diagnosis not present

## 2020-10-25 DIAGNOSIS — Z6829 Body mass index (BMI) 29.0-29.9, adult: Secondary | ICD-10-CM | POA: Diagnosis not present

## 2020-10-25 DIAGNOSIS — E559 Vitamin D deficiency, unspecified: Secondary | ICD-10-CM | POA: Diagnosis not present

## 2020-10-25 DIAGNOSIS — Z0000 Encounter for general adult medical examination without abnormal findings: Secondary | ICD-10-CM | POA: Diagnosis not present

## 2020-10-25 DIAGNOSIS — Z1389 Encounter for screening for other disorder: Secondary | ICD-10-CM | POA: Diagnosis not present

## 2020-10-25 DIAGNOSIS — I129 Hypertensive chronic kidney disease with stage 1 through stage 4 chronic kidney disease, or unspecified chronic kidney disease: Secondary | ICD-10-CM | POA: Diagnosis not present

## 2020-10-25 DIAGNOSIS — E663 Overweight: Secondary | ICD-10-CM | POA: Diagnosis not present

## 2020-12-13 ENCOUNTER — Encounter: Payer: Self-pay | Admitting: Internal Medicine

## 2020-12-13 ENCOUNTER — Ambulatory Visit (INDEPENDENT_AMBULATORY_CARE_PROVIDER_SITE_OTHER): Payer: Medicare Other | Admitting: Internal Medicine

## 2020-12-13 ENCOUNTER — Other Ambulatory Visit: Payer: Self-pay

## 2020-12-13 VITALS — BP 120/68 | HR 75 | Ht 65.0 in | Wt 177.8 lb

## 2020-12-13 DIAGNOSIS — I10 Essential (primary) hypertension: Secondary | ICD-10-CM | POA: Diagnosis not present

## 2020-12-13 DIAGNOSIS — E785 Hyperlipidemia, unspecified: Secondary | ICD-10-CM

## 2020-12-13 DIAGNOSIS — M791 Myalgia, unspecified site: Secondary | ICD-10-CM | POA: Diagnosis not present

## 2020-12-13 DIAGNOSIS — R072 Precordial pain: Secondary | ICD-10-CM

## 2020-12-13 NOTE — Progress Notes (Signed)
Cardiology Office Note:    Date:  12/13/2020   ID:  Tamara Pruitt, DOB [birth year], MRN [id number]  PCP:  Kathyrn Lass, MD  Cardiologist:  None  Electrophysiologist:  None   Referring MD: Kathyrn Lass, MD   Chief Complaint/Reason for Referral: Follow up chest pain, HTN  History of Present Illness:    Tamara Pruitt is a 75 y.o. female with a history of type 2 diabetes, chronic kidney disease stage III, hyperlipidemia, supraventricular tachycardia, HTN and chest pain. Presents for follow up.  Some myalgias with atorvastatin, leg cramps. Discussed some strategies to mitigate including transition to rosuvastatin.  The patient denies chest pain, chest pressure, dyspnea at rest or with exertion, palpitations, PND, orthopnea, or leg swelling. Denies cough, fever, chills. Denies nausea, vomiting. Denies syncope or presyncope. Denies dizziness or lightheadedness.  Past Medical History:  Diagnosis Date   Chronic kidney disease    stage 3 CKD   Diabetes mellitus without complication ([place])    Discharge from left nipple    Hyperlipidemia     Past Surgical History:  Procedure Laterality Date   BREAST BIOPSY Left 10/31/2017   Procedure: LEFT BREAST BIOPSY;  Surgeon: Alphonsa Overall, MD;  Location: [place];  Service: General;  Laterality: Left;   CESAREAN SECTION     COLONOSCOPY     OVARIAN CYST REMOVAL      Current Medications: Current Meds  Medication Sig   atorvastatin (LIPITOR) 10 MG tablet Take 10 mg by mouth daily.   Cyanocobalamin (VITAMIN B 12) 500 MCG TABS Take 500 mcg by mouth daily.   losartan (COZAAR) 50 MG tablet Take 1 tablet (50 mg total) by mouth daily.   metFORMIN (GLUCOPHAGE) 500 MG tablet Take 500 mg by mouth daily. Patient takes 4-5 tablets daily   metoprolol succinate (TOPROL-XL) 25 MG 24 hr tablet Take 1 tablet (25 mg total) by mouth daily.   Vitamin D, Ergocalciferol, (DRISDOL) 50000 units CAPS capsule Take 50,000 Units by mouth every 7 (seven)  days.     Allergies:   Patient has no known allergies.   Social History   Tobacco Use   Smoking status: Never   Smokeless tobacco: Never  Substance Use Topics   Alcohol use: Never   Drug use: Never     Family History: The patient's family history includes Breast cancer in her maternal aunt and paternal aunt; Breast cancer (age of onset: 64) in her mother; Breast cancer (age of onset: 41) in her sister.  ROS:   Please see the history of present illness.    All other systems reviewed and are negative.  EKGs/Labs/Other Studies Reviewed:    The following studies were reviewed today:  EKG:  NSR, LAE 05/03/20 NSR, possible LAE   Recent Labs: 02/16/2020: BUN 28; Creatinine, Ser 1.16; Potassium 4.6; Sodium 137  Recent Lipid Panel No results found for: CHOL, TRIG, HDL, CHOLHDL, VLDL, LDLCALC, LDLDIRECT  Physical Exam:    VS:  BP 120/68 (BP Location: Right Arm)   Pulse 75   Ht 5\' 5"  (1.651 m)   Wt 177 lb 12.8 oz (80.6 kg)   SpO2 96%   BMI 29.59 kg/m     Wt Readings from Last 5 Encounters:  12/13/20 177 lb 12.8 oz (80.6 kg)  05/03/20 179 lb (81.2 kg)  03/18/20 178 lb (80.7 kg)  02/10/20 178 lb (80.7 kg)  11/03/19 179 lb 12.8 oz (81.6 kg)    Constitutional: No acute distress Eyes: sclera non-icteric, normal conjunctiva  and lids ENMT: normal dentition, moist mucous membranes Cardiovascular: regular rhythm, normal rate, no murmurs. S1 and S2 normal. Radial pulses normal bilaterally. No jugular venous distention.  Respiratory: clear to auscultation bilaterally GI : normal bowel sounds, soft and nontender. No distention.   MSK: extremities warm, well perfused. No edema.  NEURO: grossly nonfocal exam, moves all extremities. PSYCH: alert and oriented x 3, normal mood and affect.   ASSESSMENT:    1. Essential hypertension   2. Precordial pain   3. Hyperlipidemia, unspecified hyperlipidemia type   4. Myalgia     PLAN:    Essential hypertension - Plan: EKG  12-Lead Continue metoprolol succ 25 mg daily and losartan 50 mg daily, doing well on this dose. BP under good control.  Precordial pain - no recurrence. Minimal CAD on CCTA recently.   Hyperlipidemia, unspecified hyperlipidemia type Myalgia - continue atorvastatin 10 mg daily. Lipids optimized. If myalgias can transition to crestor 5 mg daily.   Total time of encounter: 30 minutes total time of encounter, including 20 minutes spent in face-to-face patient care on the date of this encounter. This time includes coordination of care and counseling regarding above mentioned problem list. Remainder of non-face-to-face time involved reviewing chart documents/testing relevant to the patient encounter and documentation in the medical record. I have independently reviewed documentation from referring provider.   Cherlynn Kaiser, MD, [facility]     Medication Adjustments/Labs and Tests Ordered: Current medicines are reviewed at length with the patient today.  Concerns regarding medicines are outlined above.   Orders Placed This Encounter  Procedures   EKG 12-Lead    No orders of the defined types were placed in this encounter.   Patient Instructions  Medication Instructions:  No Changes In Medications at this time.  IF YOU WOULD LIKE TO SWITCH YOUR CHOLESTEROL MEDICATION TO ROSUVASTATIN PLEASE CALL ([phone]) *If you need a refill on your cardiac medications before your next appointment, please call your pharmacy*  Follow-Up: At [facility], you and your health needs are our priority.  As part of our continuing mission to provide you with exceptional heart care, we have created designated Provider Care Teams.  These Care Teams include your primary Cardiologist (physician) and Advanced Practice Providers (APPs -  Physician Assistants and Nurse Practitioners) who all work together to provide you with the care you need, when you need it.  Your next appointment:    1 year(s)  The format for your next appointment:   In Person  Provider:   Cherlynn Kaiser, MD

## 2020-12-13 NOTE — Patient Instructions (Signed)
Medication Instructions:  No Changes In Medications at this time.  IF YOU WOULD LIKE TO SWITCH YOUR CHOLESTEROL MEDICATION TO ROSUVASTATIN PLEASE CALL ([phone]) *If you need a refill on your cardiac medications before your next appointment, please call your pharmacy*  Follow-Up: At [facility], you and your health needs are our priority.  As part of our continuing mission to provide you with exceptional heart care, we have created designated Provider Care Teams.  These Care Teams include your primary Cardiologist (physician) and Advanced Practice Providers (APPs -  Physician Assistants and Nurse Practitioners) who all work together to provide you with the care you need, when you need it.  Your next appointment:   1 year(s)  The format for your next appointment:   In Person  Provider:   Cherlynn Kaiser, MD

## 2020-12-15 ENCOUNTER — Other Ambulatory Visit: Payer: Self-pay | Admitting: Family Medicine

## 2020-12-15 DIAGNOSIS — Z1231 Encounter for screening mammogram for malignant neoplasm of breast: Secondary | ICD-10-CM

## 2021-01-07 DIAGNOSIS — Z23 Encounter for immunization: Secondary | ICD-10-CM | POA: Diagnosis not present

## 2021-01-24 DIAGNOSIS — E118 Type 2 diabetes mellitus with unspecified complications: Secondary | ICD-10-CM | POA: Diagnosis not present

## 2021-01-25 ENCOUNTER — Telehealth: Payer: Self-pay | Admitting: Internal Medicine

## 2021-01-25 NOTE — Telephone Encounter (Signed)
Patient states she would like to switch from Atorvastatin to Crestor. She states this has already been discussed with Dr. Margaretann Loveless because she was experiencing leg cramping with the Atorvastatin. She states she is almost out of Atorvastatin and if approved, she would like to have her medication sent to the pharmacy listed below.   *STAT* If patient is at the pharmacy, call can be transferred to refill team.   1. Which medications need to be refilled? (please list name of each medication and dose if known) Crestor   2. Which pharmacy/location (including street and city if local pharmacy) is medication to be sent to? [place], [place]  3. Do they need a 30 day or 90 day supply?  90 day supply

## 2021-01-26 MED ORDER — ROSUVASTATIN CALCIUM 5 MG PO TABS: 5.0000 mg | ORAL_TABLET | Freq: Every day | ORAL | 3 refills | Status: DC

## 2021-01-26 NOTE — Telephone Encounter (Signed)
Called patient, LVM advising that Dr.Acharya approved switch from Atorvastatin to Crestor- RX sent to pharmacy, med list updated.  Left call back number if questions or concerns.

## 2021-01-31 ENCOUNTER — Other Ambulatory Visit: Payer: Self-pay

## 2021-01-31 ENCOUNTER — Ambulatory Visit
Admission: RE | Admit: 2021-01-31 | Discharge: 2021-01-31 | Disposition: A | Discharge status: Home / Self Care | Payer: Medicare Other | Source: Ambulatory Visit | Attending: Family Medicine | Admitting: Family Medicine

## 2021-01-31 DIAGNOSIS — Z1231 Encounter for screening mammogram for malignant neoplasm of breast: Secondary | ICD-10-CM

## 2021-02-11 DIAGNOSIS — Z23 Encounter for immunization: Secondary | ICD-10-CM | POA: Diagnosis not present

## 2021-02-14 DIAGNOSIS — E1122 Type 2 diabetes mellitus with diabetic chronic kidney disease: Secondary | ICD-10-CM | POA: Diagnosis not present

## 2021-02-22 IMAGING — CT CT HEART MORP W/ CTA COR W/ SCORE W/ CA W/CM &/OR W/O CM
4 of 7 series · 8 of 20 positions shown, 9 images · IV contrast (omnipaque)
Comparison: None.
COMPARISON: None.

Addendum:
EXAM:
OVER-READ INTERPRETATION  CT CHEST

The following report is an over-read performed by radiologist Dr.
Mark Steve Nalugon [[email or web address]] on 02/23/2020. This
over-read does not include interpretation of cardiac or coronary
anatomy or pathology. The coronary calcium score/coronary CTA
interpretation by the cardiologist is attached.
HISTORY: Chest pain, nonspecific
Cardiac/Coronary  CT
TECHNIQUE: The patient was scanned on a Siemens Force scanner.
PROTOCOL: A 120 kV prospective scan was triggered in the descending thoracic
aorta at 111 HU's. Axial non-contrast 3 mm slices were carried out
through the heart. The data set was analyzed on a dedicated work
station and scored using the Agatson method. Gantry rotation speed
was 250 msecs and collimation was .6 mm. Beta blockade and 0.8 mg of
sl NTG was given. The 3D data set was reconstructed in 5% intervals
of the 67-82 % of the R-R cycle. Diastolic phases were analyzed on a
dedicated work station using MPR, MIP and VRT modes. The patient
received 80mL OMNIPAQUE IOHEXOL 350 MG/ML SOLN of contrast.

[Series 6: best diast 72 % · axial · 0.39mm/px · z∈[+1244,+1280]mm · 2 of 271 slices shown, 3 images]
[im 91/271  vessel]
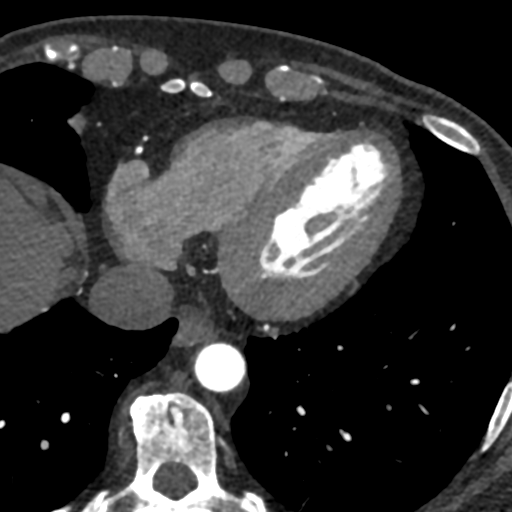
[im 91/271  lung]
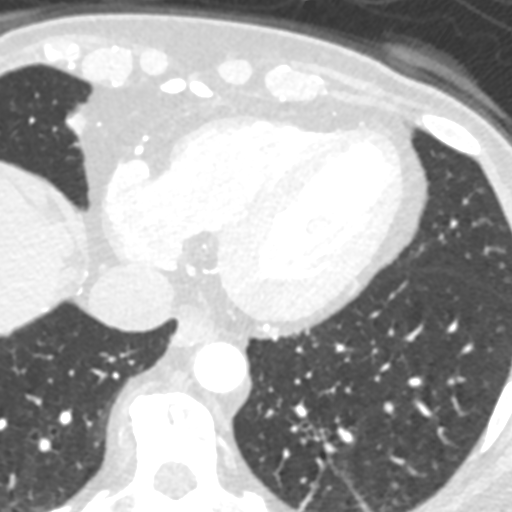
[im 181/271  vessel]
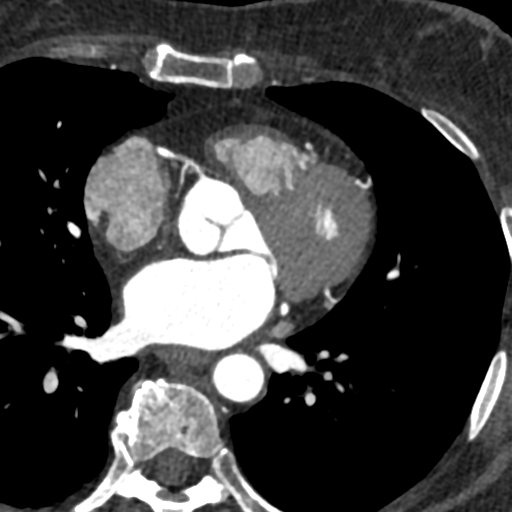

[Series 7: best syst 40 % · axial · 0.39mm/px · z∈[+1244,+1280]mm · 2 of 271 slices shown]
[im 91/271  vessel]
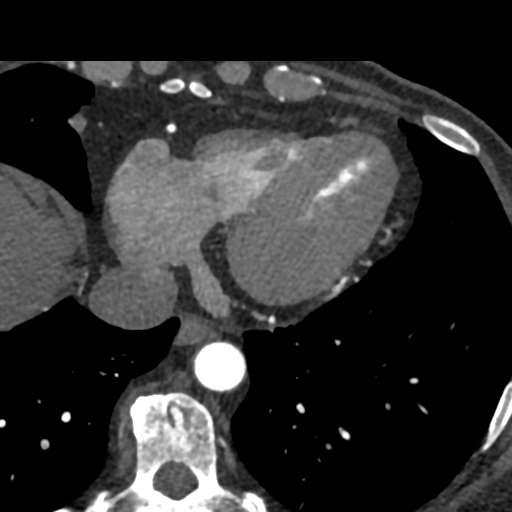
[im 181/271  vessel]
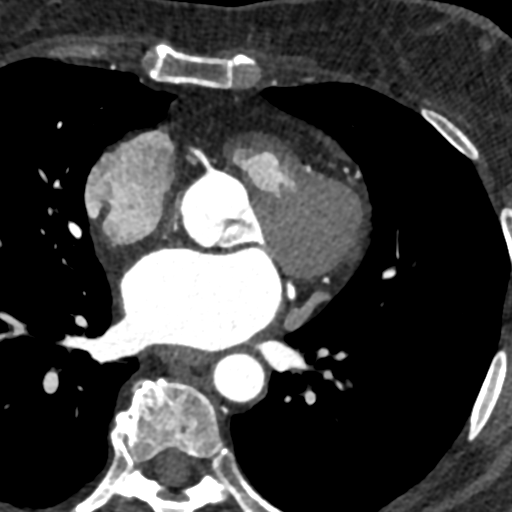

[Series 8: ts diast sharp 72 % · axial · 0.39mm/px · z∈[+1244,+1280]mm · 2 of 271 slices shown]
[im 91/271  lung]
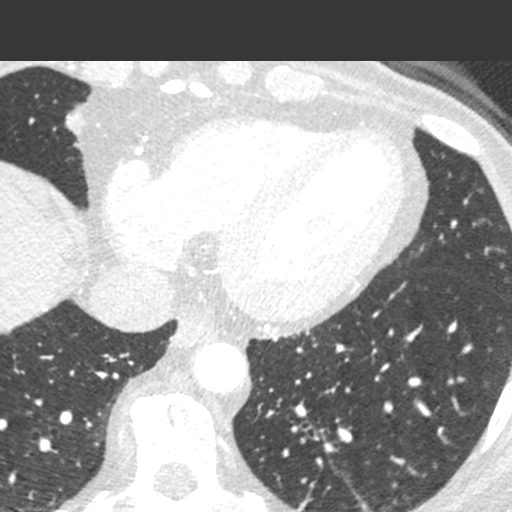
[im 181/271  lung]
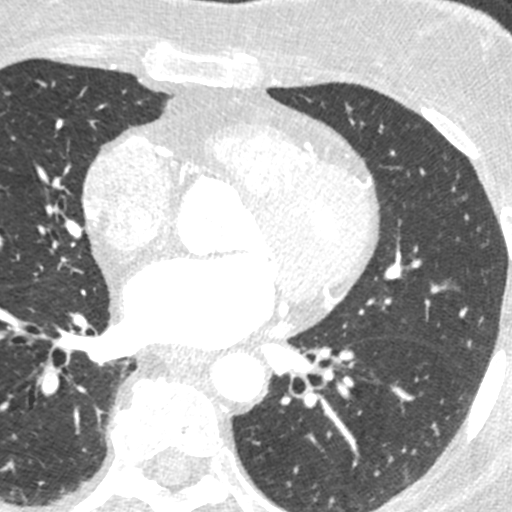

[Series 9: ts syst sharp 40 % · axial · 0.39mm/px · z∈[+1244,+1280]mm · 2 of 271 slices shown]
[im 91/271  lung]
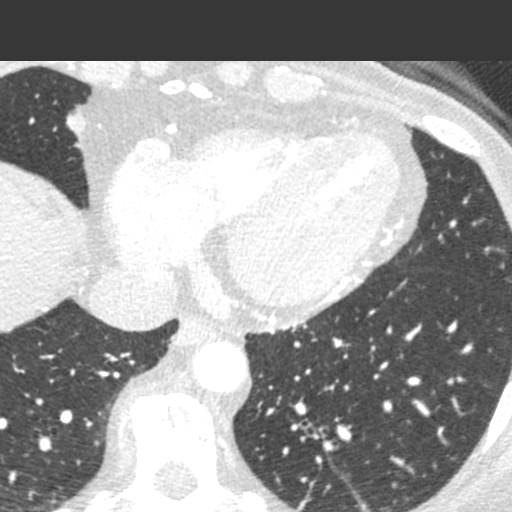
[im 181/271  lung]
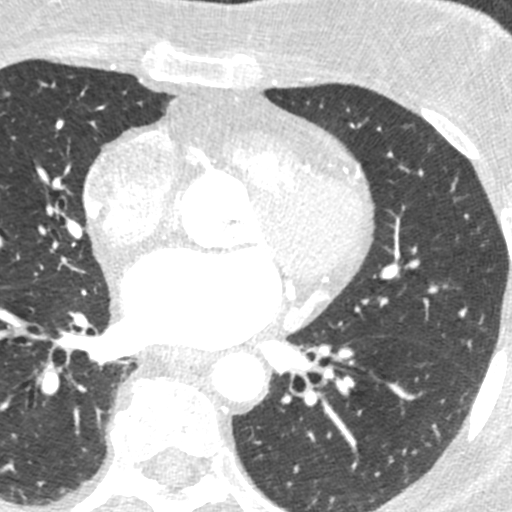

[8 of 20 positions shown; findings below may reference images not displayed]

FINDINGS: Mild linear scarring in the left lower lobe. Aortic atherosclerosis.
Within the visualized portions of the thorax there are no suspicious
appearing pulmonary nodules or masses, there is no acute
consolidative airspace disease, no pleural effusions, no
pneumothorax and no lymphadenopathy. Visualized portions of the
upper abdomen are unremarkable. There are no aggressive appearing
lytic or blastic lesions noted in the visualized portions of the
skeleton. Segmentation anomaly in a midthoracic vertebral body.
IMPRESSION: 1.  Aortic Atherosclerosis (3Z87O-N7E.E).
FINDINGS: Image quality: good

Noise artifact is: Limited.

Coronary calcium score is 5, which places the patient in the 38th
percentile for age and sex matched control.

Coronary arteries: Normal coronary origins.  Right dominance.

Tortuous coronary arteries.

Right Coronary Artery: Minimal atherosclerotic plaque in proximal
RCA, <25% stenosis. Large RV branch is patent without plaque.

Left Main Coronary Artery: No detectable plaque or stenosis.

Left Anterior Descending Coronary Artery: No detectable plaque or
stenosis. Large first diagonal with no detectable plaque or
stenosis.

Left Circumflex Artery: Minimal atherosclerotic plaque in proximal L
circumflex artery, <25% stenosis. Minimal mixed atherosclerotic
plaque in the proximal OM1, <25% stenosis.

Aorta: Normal size, 27 mm at the mid ascending aorta (level of the
PA bifurcation) measured double oblique. Mild aortic root
calcifications. No dissection.

Aortic Valve: No calcifications.

Other findings:

Normal pulmonary vein drainage into the left atrium.

Normal left atrial appendage without a thrombus.

Normal size of the pulmonary artery.

Moderate mitral annular calcification.

Lipomatous atrial septum.
IMPRESSION: 1. Minimal CAD, CADRADS = 1.

2. Coronary calcium score of 5. This was 38th percentile for age and
sex matched control.

3. Normal coronary origin with right dominance.

*** End of Addendum ***
EXAM:
OVER-READ INTERPRETATION  CT CHEST

The following report is an over-read performed by radiologist Dr.
Mark Steve Nalugon [[email or web address]] on 02/23/2020. This
over-read does not include interpretation of cardiac or coronary
anatomy or pathology. The coronary calcium score/coronary CTA
interpretation by the cardiologist is attached.
FINDINGS: Mild linear scarring in the left lower lobe. Aortic atherosclerosis.
Within the visualized portions of the thorax there are no suspicious
appearing pulmonary nodules or masses, there is no acute
consolidative airspace disease, no pleural effusions, no
pneumothorax and no lymphadenopathy. Visualized portions of the
upper abdomen are unremarkable. There are no aggressive appearing
lytic or blastic lesions noted in the visualized portions of the
skeleton. Segmentation anomaly in a midthoracic vertebral body.
IMPRESSION: 1.  Aortic Atherosclerosis (3Z87O-N7E.E).

## 2021-03-06 ENCOUNTER — Emergency Department (INDEPENDENT_AMBULATORY_CARE_PROVIDER_SITE_OTHER)
Admission: EM | Admit: 2021-03-06 | Discharge: 2021-03-06 | Disposition: A | Discharge status: Home / Self Care | Payer: Medicare Other | Source: Home / Self Care

## 2021-03-06 ENCOUNTER — Other Ambulatory Visit: Payer: Self-pay

## 2021-03-06 DIAGNOSIS — R531 Weakness: Secondary | ICD-10-CM | POA: Diagnosis not present

## 2021-03-06 LAB — POCT FASTING CBG KUC MANUAL ENTRY: POCT Glucose (KUC): 260 mg/dL — AB (ref 70–99)

## 2021-03-06 NOTE — ED Notes (Signed)
Pt wanted blood glucose checked since shes diabetic and doesn't have machine at home. Worried some of her sxs may be caused by her blood sugar.

## 2021-03-06 NOTE — Discharge Instructions (Addendum)
Advised patient we will follow-up with her once COVID-19 flu A/B results are received.

## 2021-03-06 NOTE — ED Triage Notes (Signed)
Pt c/o chills and fatigue as well as shortness of breath since mid last week. Cough sometimes when she exerts herself. No known covid or flu exposure. Tylenol prn. No chills for 3-4 days.

## 2021-03-06 NOTE — ED Provider Notes (Signed)
[facility]    CSN: [id number] Arrival date & time: 03/06/21  1819      History   Chief Complaint Chief Complaint  Patient presents with   Flu like sxs    HPI Tamara Pruitt is a 75 y.o. female.   HPI 75 year old female presents with chills and fatigue, shortness of breath since mid last week.  Cough when exerting herself.  No known COVID or flu exposure.  PMH significant for CKD stage III and Q2VZ without complication.  Past Medical History:  Diagnosis Date   Chronic kidney disease    stage 3 CKD   Diabetes mellitus without complication ([place])    Discharge from left nipple    Hyperlipidemia     There are no problems to display for this patient.   Past Surgical History:  Procedure Laterality Date   BREAST BIOPSY Left 10/31/2017   Procedure: LEFT BREAST BIOPSY;  Surgeon: Alphonsa Overall, MD;  Location: [place];  Service: General;  Laterality: Left;   CESAREAN SECTION     COLONOSCOPY     OVARIAN CYST REMOVAL      OB History   No obstetric history on file.      Home Medications    Prior to Admission medications   Medication Sig Start Date End Date Taking? Authorizing Provider  Cyanocobalamin (VITAMIN B 12) 500 MCG TABS Take 500 mcg by mouth daily. 04/27/20   [provider]  losartan (COZAAR) 50 MG tablet Take 1 tablet (50 mg total) by mouth daily. 05/03/20   Elouise Munroe, MD  metFORMIN (GLUCOPHAGE) 500 MG tablet Take 500 mg by mouth daily. Patient takes 4-5 tablets daily 04/21/20   [provider]  metoprolol succinate (TOPROL-XL) 25 MG 24 hr tablet Take 1 tablet (25 mg total) by mouth daily. 11/03/19   Elouise Munroe, MD  rosuvastatin (CRESTOR) 5 MG tablet Take 1 tablet (5 mg total) by mouth daily. 01/26/21 04/26/21  Elouise Munroe, MD  Vitamin D, Ergocalciferol, (DRISDOL) 50000 units CAPS capsule Take 50,000 Units by mouth every 7 (seven) days.    [provider]    Family History Family  History  Problem Relation Age of Onset   Breast cancer Mother 70   Breast cancer Sister 57   Breast cancer Maternal Aunt        x3   Breast cancer Paternal Aunt        x2    Social History Social History   Tobacco Use   Smoking status: Never   Smokeless tobacco: Never  Substance Use Topics   Alcohol use: Never   Drug use: Never     Allergies   Patient has no known allergies.   Review of Systems Review of Systems  Constitutional:  Positive for chills and fever.  Respiratory:  Positive for cough.   All other systems reviewed and are negative.   Physical Exam Triage Vital Signs ED Triage Vitals  Enc Vitals Group     BP 03/06/21 1929 138/79     Pulse Rate 03/06/21 1929 (!) 122     Resp 03/06/21 1929 18     Temp 03/06/21 1929 99.6 F (37.6 C)     Temp Source 03/06/21 1929 Oral     SpO2 03/06/21 1929 97 %     Weight --      Height --      Head Circumference --      Peak Flow --  Pain Score 03/06/21 1931 0     Pain Loc --      Pain Edu? --      Excl. in [place]? --    No data found.  Updated Vital Signs BP 138/79 (BP Location: Right Arm)   Pulse (!) 122   Temp 99.6 F (37.6 C) (Oral)   Resp 18   SpO2 97%      Physical Exam Vitals and nursing note reviewed.  Constitutional:      General: She is not in acute distress.    Appearance: Normal appearance. She is normal weight. She is not ill-appearing.  HENT:     Head: Normocephalic and atraumatic.     Right Ear: Tympanic membrane, ear canal and external ear normal.     Left Ear: Tympanic membrane, ear canal and external ear normal.     Mouth/Throat:     Mouth: Mucous membranes are moist.     Pharynx: Oropharynx is clear.  Eyes:     Extraocular Movements: Extraocular movements intact.     Conjunctiva/sclera: Conjunctivae normal.     Pupils: Pupils are equal, round, and reactive to light.  Cardiovascular:     Rate and Rhythm: Normal rate and regular rhythm.     Pulses: Normal pulses.     Heart  sounds: Normal heart sounds.  Pulmonary:     Effort: Pulmonary effort is normal.     Breath sounds: Normal breath sounds.  Musculoskeletal:        General: Normal range of motion.     Cervical back: Normal range of motion and neck supple.  Skin:    General: Skin is warm and dry.  Neurological:     General: No focal deficit present.     Mental Status: She is alert and oriented to person, place, and time.     UC Treatments / Results  Labs (all labs ordered are listed, but only abnormal results are displayed) Labs Reviewed  POCT FASTING CBG [facility] MANUAL ENTRY - Abnormal; Notable for the following components:      Result Value   POCT Glucose ([facility]) 260 (*)    All other components within normal limits  COVID-19, FLU A+B NAA    EKG   Radiology No results found.  Procedures Procedures (including critical care time)  Medications Ordered in UC Medications - No data to display  Initial Impression / Assessment and Plan / UC Course  I have reviewed the triage vital signs and the nursing notes.  Pertinent labs & imaging results that were available during my care of the patient were reviewed by me and considered in my medical decision making (see chart for details).     MDM: 1.  Weakness-COVID-19, flu A/B ordered. Advised patient we will follow-up with her once COVID-19 flu A/B results are received.  Patient discharged home, hemodynamically stable. Final Clinical Impressions(s) / UC Diagnoses   Final diagnoses:  Weakness     Discharge Instructions      Advised patient we will follow-up with her once COVID-19 flu A/B results are received.     ED Prescriptions   None    PDMP not reviewed this encounter.   Eliezer Lofts, [place] 03/06/21 2052

## 2021-03-09 DIAGNOSIS — R5383 Other fatigue: Secondary | ICD-10-CM | POA: Diagnosis not present

## 2021-03-09 DIAGNOSIS — R8281 Pyuria: Secondary | ICD-10-CM | POA: Diagnosis not present

## 2021-03-09 LAB — COVID-19, FLU A+B NAA
Influenza A, NAA: NOT DETECTED
Influenza B, NAA: NOT DETECTED
SARS-CoV-2, NAA: NOT DETECTED

## 2021-03-10 DIAGNOSIS — A419 Sepsis, unspecified organism: Secondary | ICD-10-CM | POA: Diagnosis not present

## 2021-03-10 DIAGNOSIS — E871 Hypo-osmolality and hyponatremia: Secondary | ICD-10-CM | POA: Diagnosis not present

## 2021-03-10 DIAGNOSIS — N179 Acute kidney failure, unspecified: Secondary | ICD-10-CM | POA: Diagnosis not present

## 2021-03-10 DIAGNOSIS — N1330 Unspecified hydronephrosis: Secondary | ICD-10-CM | POA: Diagnosis not present

## 2021-03-10 DIAGNOSIS — A4159 Other Gram-negative sepsis: Secondary | ICD-10-CM | POA: Diagnosis not present

## 2021-03-10 DIAGNOSIS — N136 Pyonephrosis: Secondary | ICD-10-CM | POA: Diagnosis not present

## 2021-03-10 DIAGNOSIS — Z20822 Contact with and (suspected) exposure to covid-19: Secondary | ICD-10-CM | POA: Diagnosis not present

## 2021-03-10 DIAGNOSIS — K5730 Diverticulosis of large intestine without perforation or abscess without bleeding: Secondary | ICD-10-CM | POA: Diagnosis not present

## 2021-03-10 DIAGNOSIS — J984 Other disorders of lung: Secondary | ICD-10-CM | POA: Diagnosis not present

## 2021-03-10 DIAGNOSIS — R059 Cough, unspecified: Secondary | ICD-10-CM | POA: Diagnosis not present

## 2021-03-10 DIAGNOSIS — I471 Supraventricular tachycardia: Secondary | ICD-10-CM | POA: Diagnosis not present

## 2021-03-10 DIAGNOSIS — N132 Hydronephrosis with renal and ureteral calculous obstruction: Secondary | ICD-10-CM | POA: Diagnosis not present

## 2021-03-10 DIAGNOSIS — R531 Weakness: Secondary | ICD-10-CM | POA: Diagnosis not present

## 2021-03-11 DIAGNOSIS — A414 Sepsis due to anaerobes: Secondary | ICD-10-CM | POA: Diagnosis not present

## 2021-03-11 DIAGNOSIS — R531 Weakness: Secondary | ICD-10-CM | POA: Diagnosis not present

## 2021-03-11 DIAGNOSIS — I080 Rheumatic disorders of both mitral and aortic valves: Secondary | ICD-10-CM | POA: Diagnosis not present

## 2021-03-11 DIAGNOSIS — N200 Calculus of kidney: Secondary | ICD-10-CM | POA: Diagnosis not present

## 2021-03-11 DIAGNOSIS — N171 Acute kidney failure with acute cortical necrosis: Secondary | ICD-10-CM | POA: Diagnosis not present

## 2021-03-11 DIAGNOSIS — N1330 Unspecified hydronephrosis: Secondary | ICD-10-CM | POA: Diagnosis not present

## 2021-03-11 DIAGNOSIS — I10 Essential (primary) hypertension: Secondary | ICD-10-CM | POA: Diagnosis not present

## 2021-03-11 DIAGNOSIS — Z20822 Contact with and (suspected) exposure to covid-19: Secondary | ICD-10-CM | POA: Diagnosis present

## 2021-03-11 DIAGNOSIS — E119 Type 2 diabetes mellitus without complications: Secondary | ICD-10-CM | POA: Diagnosis not present

## 2021-03-11 DIAGNOSIS — Z79899 Other long term (current) drug therapy: Secondary | ICD-10-CM | POA: Diagnosis not present

## 2021-03-11 DIAGNOSIS — D649 Anemia, unspecified: Secondary | ICD-10-CM | POA: Diagnosis not present

## 2021-03-11 DIAGNOSIS — E871 Hypo-osmolality and hyponatremia: Secondary | ICD-10-CM | POA: Diagnosis present

## 2021-03-11 DIAGNOSIS — J984 Other disorders of lung: Secondary | ICD-10-CM | POA: Diagnosis not present

## 2021-03-11 DIAGNOSIS — N1831 Chronic kidney disease, stage 3a: Secondary | ICD-10-CM | POA: Diagnosis present

## 2021-03-11 DIAGNOSIS — R6520 Severe sepsis without septic shock: Secondary | ICD-10-CM | POA: Diagnosis present

## 2021-03-11 DIAGNOSIS — Z8742 Personal history of other diseases of the female genital tract: Secondary | ICD-10-CM | POA: Diagnosis not present

## 2021-03-11 DIAGNOSIS — D509 Iron deficiency anemia, unspecified: Secondary | ICD-10-CM | POA: Diagnosis present

## 2021-03-11 DIAGNOSIS — E1122 Type 2 diabetes mellitus with diabetic chronic kidney disease: Secondary | ICD-10-CM | POA: Diagnosis present

## 2021-03-11 DIAGNOSIS — A4159 Other Gram-negative sepsis: Secondary | ICD-10-CM | POA: Diagnosis present

## 2021-03-11 DIAGNOSIS — N179 Acute kidney failure, unspecified: Secondary | ICD-10-CM | POA: Diagnosis present

## 2021-03-11 DIAGNOSIS — N135 Crossing vessel and stricture of ureter without hydronephrosis: Secondary | ICD-10-CM | POA: Diagnosis not present

## 2021-03-11 DIAGNOSIS — N136 Pyonephrosis: Secondary | ICD-10-CM | POA: Diagnosis present

## 2021-03-11 DIAGNOSIS — I129 Hypertensive chronic kidney disease with stage 1 through stage 4 chronic kidney disease, or unspecified chronic kidney disease: Secondary | ICD-10-CM | POA: Diagnosis present

## 2021-03-11 DIAGNOSIS — Z7984 Long term (current) use of oral hypoglycemic drugs: Secondary | ICD-10-CM | POA: Diagnosis not present

## 2021-03-11 DIAGNOSIS — Z7982 Long term (current) use of aspirin: Secondary | ICD-10-CM | POA: Diagnosis not present

## 2021-03-11 DIAGNOSIS — N390 Urinary tract infection, site not specified: Secondary | ICD-10-CM | POA: Diagnosis not present

## 2021-03-11 DIAGNOSIS — D72829 Elevated white blood cell count, unspecified: Secondary | ICD-10-CM | POA: Diagnosis not present

## 2021-03-11 DIAGNOSIS — A419 Sepsis, unspecified organism: Secondary | ICD-10-CM | POA: Diagnosis not present

## 2021-03-11 DIAGNOSIS — K5730 Diverticulosis of large intestine without perforation or abscess without bleeding: Secondary | ICD-10-CM | POA: Diagnosis not present

## 2021-03-11 DIAGNOSIS — N201 Calculus of ureter: Secondary | ICD-10-CM | POA: Diagnosis not present

## 2021-03-11 DIAGNOSIS — A4150 Gram-negative sepsis, unspecified: Secondary | ICD-10-CM | POA: Diagnosis not present

## 2021-03-11 DIAGNOSIS — R7881 Bacteremia: Secondary | ICD-10-CM | POA: Diagnosis not present

## 2021-03-11 DIAGNOSIS — I517 Cardiomegaly: Secondary | ICD-10-CM | POA: Diagnosis not present

## 2021-03-11 DIAGNOSIS — I471 Supraventricular tachycardia: Secondary | ICD-10-CM | POA: Diagnosis present

## 2021-03-11 DIAGNOSIS — N132 Hydronephrosis with renal and ureteral calculous obstruction: Secondary | ICD-10-CM | POA: Diagnosis not present

## 2021-03-16 DIAGNOSIS — D72829 Elevated white blood cell count, unspecified: Secondary | ICD-10-CM | POA: Diagnosis not present

## 2021-03-16 DIAGNOSIS — K5730 Diverticulosis of large intestine without perforation or abscess without bleeding: Secondary | ICD-10-CM | POA: Diagnosis not present

## 2021-03-16 DIAGNOSIS — N200 Calculus of kidney: Secondary | ICD-10-CM | POA: Diagnosis not present

## 2021-03-16 DIAGNOSIS — N390 Urinary tract infection, site not specified: Secondary | ICD-10-CM | POA: Diagnosis not present

## 2021-03-21 DIAGNOSIS — D72828 Other elevated white blood cell count: Secondary | ICD-10-CM | POA: Diagnosis not present

## 2021-03-21 DIAGNOSIS — A419 Sepsis, unspecified organism: Secondary | ICD-10-CM | POA: Diagnosis not present

## 2021-03-21 DIAGNOSIS — N390 Urinary tract infection, site not specified: Secondary | ICD-10-CM | POA: Diagnosis not present

## 2021-03-22 DIAGNOSIS — N3090 Cystitis, unspecified without hematuria: Secondary | ICD-10-CM | POA: Diagnosis not present

## 2021-03-22 DIAGNOSIS — N1330 Unspecified hydronephrosis: Secondary | ICD-10-CM | POA: Diagnosis not present

## 2021-04-12 DIAGNOSIS — I129 Hypertensive chronic kidney disease with stage 1 through stage 4 chronic kidney disease, or unspecified chronic kidney disease: Secondary | ICD-10-CM | POA: Diagnosis not present

## 2021-04-12 DIAGNOSIS — D72828 Other elevated white blood cell count: Secondary | ICD-10-CM | POA: Diagnosis not present

## 2021-04-12 DIAGNOSIS — E1122 Type 2 diabetes mellitus with diabetic chronic kidney disease: Secondary | ICD-10-CM | POA: Diagnosis not present

## 2021-04-12 DIAGNOSIS — D649 Anemia, unspecified: Secondary | ICD-10-CM | POA: Diagnosis not present

## 2021-04-12 DIAGNOSIS — N200 Calculus of kidney: Secondary | ICD-10-CM | POA: Diagnosis not present

## 2021-04-12 DIAGNOSIS — R634 Abnormal weight loss: Secondary | ICD-10-CM | POA: Diagnosis not present

## 2021-04-12 DIAGNOSIS — N289 Disorder of kidney and ureter, unspecified: Secondary | ICD-10-CM | POA: Diagnosis not present

## 2021-04-19 DIAGNOSIS — N201 Calculus of ureter: Secondary | ICD-10-CM | POA: Diagnosis not present

## 2021-04-24 DIAGNOSIS — I10 Essential (primary) hypertension: Secondary | ICD-10-CM | POA: Diagnosis not present

## 2021-04-24 DIAGNOSIS — Z960 Presence of urogenital implants: Secondary | ICD-10-CM | POA: Diagnosis not present

## 2021-04-24 DIAGNOSIS — N179 Acute kidney failure, unspecified: Secondary | ICD-10-CM | POA: Diagnosis not present

## 2021-04-24 DIAGNOSIS — E119 Type 2 diabetes mellitus without complications: Secondary | ICD-10-CM | POA: Diagnosis not present

## 2021-04-24 DIAGNOSIS — N200 Calculus of kidney: Secondary | ICD-10-CM | POA: Diagnosis not present

## 2021-04-25 DIAGNOSIS — I10 Essential (primary) hypertension: Secondary | ICD-10-CM | POA: Diagnosis not present

## 2021-04-25 DIAGNOSIS — E785 Hyperlipidemia, unspecified: Secondary | ICD-10-CM | POA: Diagnosis not present

## 2021-04-25 DIAGNOSIS — E119 Type 2 diabetes mellitus without complications: Secondary | ICD-10-CM | POA: Diagnosis not present

## 2021-04-25 DIAGNOSIS — Z79899 Other long term (current) drug therapy: Secondary | ICD-10-CM | POA: Diagnosis not present

## 2021-04-25 DIAGNOSIS — Z436 Encounter for attention to other artificial openings of urinary tract: Secondary | ICD-10-CM | POA: Diagnosis not present

## 2021-04-25 DIAGNOSIS — Z7984 Long term (current) use of oral hypoglycemic drugs: Secondary | ICD-10-CM | POA: Diagnosis not present

## 2021-04-25 DIAGNOSIS — N201 Calculus of ureter: Secondary | ICD-10-CM | POA: Diagnosis not present

## 2021-04-25 DIAGNOSIS — Z7982 Long term (current) use of aspirin: Secondary | ICD-10-CM | POA: Diagnosis not present

## 2021-04-25 DIAGNOSIS — N200 Calculus of kidney: Secondary | ICD-10-CM | POA: Diagnosis not present

## 2021-04-25 DIAGNOSIS — Z87442 Personal history of urinary calculi: Secondary | ICD-10-CM | POA: Diagnosis not present

## 2021-04-25 DIAGNOSIS — E1169 Type 2 diabetes mellitus with other specified complication: Secondary | ICD-10-CM | POA: Diagnosis not present

## 2021-04-25 DIAGNOSIS — Z466 Encounter for fitting and adjustment of urinary device: Secondary | ICD-10-CM | POA: Diagnosis not present

## 2021-05-02 DIAGNOSIS — I129 Hypertensive chronic kidney disease with stage 1 through stage 4 chronic kidney disease, or unspecified chronic kidney disease: Secondary | ICD-10-CM | POA: Diagnosis not present

## 2021-05-02 DIAGNOSIS — E1122 Type 2 diabetes mellitus with diabetic chronic kidney disease: Secondary | ICD-10-CM | POA: Diagnosis not present

## 2021-05-02 DIAGNOSIS — E7800 Pure hypercholesterolemia, unspecified: Secondary | ICD-10-CM | POA: Diagnosis not present

## 2021-05-02 DIAGNOSIS — N183 Chronic kidney disease, stage 3 unspecified: Secondary | ICD-10-CM | POA: Diagnosis not present

## 2021-05-02 DIAGNOSIS — I700 Atherosclerosis of aorta: Secondary | ICD-10-CM | POA: Diagnosis not present

## 2021-05-05 DIAGNOSIS — N200 Calculus of kidney: Secondary | ICD-10-CM | POA: Diagnosis not present

## 2021-05-10 ENCOUNTER — Other Ambulatory Visit: Payer: Self-pay

## 2021-05-10 MED ORDER — ROSUVASTATIN CALCIUM 5 MG PO TABS: 5.0000 mg | ORAL_TABLET | Freq: Every day | ORAL | 3 refills | Status: DC

## 2021-06-05 DIAGNOSIS — N183 Chronic kidney disease, stage 3 unspecified: Secondary | ICD-10-CM | POA: Diagnosis not present

## 2021-06-15 DIAGNOSIS — N200 Calculus of kidney: Secondary | ICD-10-CM | POA: Diagnosis not present

## 2021-07-05 DIAGNOSIS — N1330 Unspecified hydronephrosis: Secondary | ICD-10-CM | POA: Diagnosis not present

## 2021-07-05 DIAGNOSIS — N200 Calculus of kidney: Secondary | ICD-10-CM | POA: Diagnosis not present

## 2021-07-05 DIAGNOSIS — R944 Abnormal results of kidney function studies: Secondary | ICD-10-CM | POA: Diagnosis not present

## 2021-07-17 DIAGNOSIS — E1122 Type 2 diabetes mellitus with diabetic chronic kidney disease: Secondary | ICD-10-CM | POA: Diagnosis not present

## 2021-07-17 DIAGNOSIS — N1832 Chronic kidney disease, stage 3b: Secondary | ICD-10-CM | POA: Diagnosis not present

## 2021-07-20 DIAGNOSIS — N183 Chronic kidney disease, stage 3 unspecified: Secondary | ICD-10-CM | POA: Diagnosis not present

## 2021-08-09 DIAGNOSIS — I129 Hypertensive chronic kidney disease with stage 1 through stage 4 chronic kidney disease, or unspecified chronic kidney disease: Secondary | ICD-10-CM | POA: Diagnosis not present

## 2021-08-09 DIAGNOSIS — E7800 Pure hypercholesterolemia, unspecified: Secondary | ICD-10-CM | POA: Diagnosis not present

## 2021-08-09 DIAGNOSIS — E1122 Type 2 diabetes mellitus with diabetic chronic kidney disease: Secondary | ICD-10-CM | POA: Diagnosis not present

## 2021-10-31 DIAGNOSIS — E1122 Type 2 diabetes mellitus with diabetic chronic kidney disease: Secondary | ICD-10-CM | POA: Diagnosis not present

## 2021-10-31 DIAGNOSIS — Z1389 Encounter for screening for other disorder: Secondary | ICD-10-CM | POA: Diagnosis not present

## 2021-10-31 DIAGNOSIS — Z0000 Encounter for general adult medical examination without abnormal findings: Secondary | ICD-10-CM | POA: Diagnosis not present

## 2021-10-31 DIAGNOSIS — Z6828 Body mass index (BMI) 28.0-28.9, adult: Secondary | ICD-10-CM | POA: Diagnosis not present

## 2021-10-31 DIAGNOSIS — I129 Hypertensive chronic kidney disease with stage 1 through stage 4 chronic kidney disease, or unspecified chronic kidney disease: Secondary | ICD-10-CM | POA: Diagnosis not present

## 2021-10-31 DIAGNOSIS — N183 Chronic kidney disease, stage 3 unspecified: Secondary | ICD-10-CM | POA: Diagnosis not present

## 2021-10-31 DIAGNOSIS — E663 Overweight: Secondary | ICD-10-CM | POA: Diagnosis not present

## 2021-10-31 DIAGNOSIS — I700 Atherosclerosis of aorta: Secondary | ICD-10-CM | POA: Diagnosis not present

## 2021-10-31 DIAGNOSIS — E7800 Pure hypercholesterolemia, unspecified: Secondary | ICD-10-CM | POA: Diagnosis not present

## 2021-11-03 ENCOUNTER — Telehealth: Payer: Self-pay

## 2021-11-03 NOTE — Patient Outreach (Signed)
  Care Management   Outreach Note  11/03/2021 Name: Tamara Pruitt MRN: [id number] DOB: 1946-02-20  An unsuccessful telephone outreach was attempted today. The patient was referred to the case management team for assistance with care management and care coordination.    Follow Up Plan:  A HIPAA compliant voice message was left today requesting a return call.  [place] Management [phone]

## 2021-11-10 DIAGNOSIS — N183 Chronic kidney disease, stage 3 unspecified: Secondary | ICD-10-CM | POA: Diagnosis not present

## 2021-11-10 DIAGNOSIS — E1122 Type 2 diabetes mellitus with diabetic chronic kidney disease: Secondary | ICD-10-CM | POA: Diagnosis not present

## 2021-11-16 DIAGNOSIS — N184 Chronic kidney disease, stage 4 (severe): Secondary | ICD-10-CM | POA: Diagnosis not present

## 2021-11-27 ENCOUNTER — Ambulatory Visit: Payer: Self-pay

## 2021-11-27 NOTE — Patient Outreach (Signed)
  Care Coordination   Initial Visit Note   11/27/2021 Name: SERGIO HOBART MRN: [id number] DOB: [birth year]  STARKISHA TULLIS is a 76 y.o. year old female who sees Kathyrn Lass, MD for primary care. I spoke with  Rafael Bihari by phone today  What matters to the patients health and wellness today?  Health Maintenance    Goals Addressed             This Visit's Progress    Health Maintenance       Care Coordination Interventions: Reviewed plan for disease management. Reports adhering to treatment plans and attending medical appointments as scheduled. Reviewed medications. Reports taking as prescribed. Profile updated to reflect discontinued medications. Denies concerns related to medication management or prescription cost. AWV up to date. Completed on 10/31/21.        SDOH assessments and interventions completed:  Yes  SDOH Interventions Today    Flowsheet Row Most Recent Value  SDOH Interventions   Food Insecurity Interventions Intervention Not Indicated  Transportation Interventions Intervention Not Indicated        Care Coordination Interventions Activated:  Yes  Care Coordination Interventions:  Yes, provided   Follow up plan:  Ms. Amiri will call for care coordination assistance as needed.    Encounter Outcome:  Pt. Visit Completed    [place] Management [phone]

## 2021-11-27 NOTE — Patient Instructions (Signed)
Visit Information Thank you for allowing the Care Management team to participate in your care. It was great speaking with you today! Please do not hesitate to call if you require assistance.  Following are the goals we discussed today:   Goals Addressed             This Visit's Progress    Health Maintenance       Care Coordination Interventions: Reviewed plan for disease management. Reports adhering to treatment plans and attending medical appointments as scheduled. Reviewed medications. Reports taking as prescribed. Profile updated to reflect discontinued medications. Denies concerns related to medication management or prescription cost. AWV up to date. Completed on 10/31/21.       Tamara Pruitt verbalized understanding of the information discussed during the telephonic outreach. Declined need for mailed instructions or resources.   Tamara Pruitt will contact the care management team for assistance as needed.  [place] Management [phone]

## 2021-12-25 ENCOUNTER — Other Ambulatory Visit: Payer: Self-pay | Admitting: Family Medicine

## 2021-12-25 DIAGNOSIS — Z1231 Encounter for screening mammogram for malignant neoplasm of breast: Secondary | ICD-10-CM

## 2022-01-08 DIAGNOSIS — N1330 Unspecified hydronephrosis: Secondary | ICD-10-CM | POA: Diagnosis not present

## 2022-01-12 DIAGNOSIS — Z23 Encounter for immunization: Secondary | ICD-10-CM | POA: Diagnosis not present

## 2022-01-30 DIAGNOSIS — E118 Type 2 diabetes mellitus with unspecified complications: Secondary | ICD-10-CM | POA: Diagnosis not present

## 2022-02-01 ENCOUNTER — Ambulatory Visit: Payer: Medicare Other

## 2022-02-06 ENCOUNTER — Ambulatory Visit
Admission: RE | Admit: 2022-02-06 | Discharge: 2022-02-06 | Disposition: A | Discharge status: Home / Self Care | Payer: Medicare Other | Source: Ambulatory Visit | Attending: Family Medicine | Admitting: Family Medicine

## 2022-02-06 DIAGNOSIS — Z1231 Encounter for screening mammogram for malignant neoplasm of breast: Secondary | ICD-10-CM | POA: Diagnosis not present

## 2022-02-07 DIAGNOSIS — E7800 Pure hypercholesterolemia, unspecified: Secondary | ICD-10-CM | POA: Diagnosis not present

## 2022-02-07 DIAGNOSIS — N183 Chronic kidney disease, stage 3 unspecified: Secondary | ICD-10-CM | POA: Diagnosis not present

## 2022-02-07 DIAGNOSIS — E1122 Type 2 diabetes mellitus with diabetic chronic kidney disease: Secondary | ICD-10-CM | POA: Diagnosis not present

## 2022-02-07 DIAGNOSIS — I129 Hypertensive chronic kidney disease with stage 1 through stage 4 chronic kidney disease, or unspecified chronic kidney disease: Secondary | ICD-10-CM | POA: Diagnosis not present

## 2022-11-12 ENCOUNTER — Ambulatory Visit: Payer: Medicare Other | Admitting: Internal Medicine

## 2022-11-12 ENCOUNTER — Encounter: Payer: Self-pay | Admitting: Internal Medicine

## 2022-11-12 VITALS — BP 104/72 | HR 77 | Ht 65.0 in | Wt 177.0 lb

## 2022-11-12 DIAGNOSIS — R072 Precordial pain: Secondary | ICD-10-CM | POA: Insufficient documentation

## 2022-11-12 DIAGNOSIS — E785 Hyperlipidemia, unspecified: Secondary | ICD-10-CM | POA: Diagnosis present

## 2022-11-12 DIAGNOSIS — N184 Chronic kidney disease, stage 4 (severe): Secondary | ICD-10-CM | POA: Insufficient documentation

## 2022-11-12 DIAGNOSIS — I10 Essential (primary) hypertension: Secondary | ICD-10-CM | POA: Diagnosis present

## 2022-11-12 NOTE — Patient Instructions (Signed)
Medication Instructions:  Your physician recommends that you continue on your current medications as directed. Please refer to the Current Medication list given to you today.  *If you need a refill on your cardiac medications before your next appointment, please call your pharmacy*   Follow-Up: At [facility], you and your health needs are our priority.  As part of our continuing mission to provide you with exceptional heart care, we have created designated Provider Care Teams.  These Care Teams include your primary Cardiologist (physician) and Advanced Practice Providers (APPs -  Physician Assistants and Nurse Practitioners) who all work together to provide you with the care you need, when you need it.  We recommend signing up for the patient portal called "MyChart".  Sign up information is provided on this After Visit Summary.  MyChart is used to connect with patients for Virtual Visits (Telemedicine).  Patients are able to view lab/test results, encounter notes, upcoming appointments, etc.  Non-urgent messages can be sent to your provider as well.   To learn more about what you can do with MyChart, go to [email or web address].    Your next appointment:   12 month(s)  Provider:   Dr. Jacques Navy

## 2022-11-12 NOTE — Progress Notes (Signed)
Cardiology Office Note:    Date:  11/12/2022   ID:  Tamara Pruitt, DOB 1945/09/15, MRN [id number]  PCP:  Sigmund Hazel, MD  Cardiologist:  None  Electrophysiologist:  None   Referring MD: Sigmund Hazel, MD   Chief Complaint/Reason for Referral: Follow up chest pain, HTN  History of Present Illness:    Tamara Pruitt is a 77 y.o. female with a history of type 2 diabetes, chronic kidney disease stage III, hyperlipidemia, supraventricular tachycardia, HTN and chest pain. Presents for follow up.  11/12/2022: Patient had a hospitalization in December 2022 with sepsis, kidney stone, and renal infection necessitating 1 week in the hospital.  At that time during that hospitalization she had an echocardiogram which was reportedly normal (images cannot be independently reviewed).  She has had no chest discomfort or significant shortness of breath after recovering from this episode.  EKG overall stable, no palpitations with 1 PVC on EKG.  Reviewed medication therapy for renal protection, discussed that she is currently stable on metoprolol, Farxiga, Zetia, atorvastatin, and insulin for management of her diabetes.  The patient denies chest pain, chest pressure, dyspnea at rest or with exertion, palpitations, PND, orthopnea, or leg swelling. Denies cough, fever, chills. Denies nausea, vomiting. Denies syncope or presyncope. Denies dizziness or lightheadedness.  Past Medical History:  Diagnosis Date   Chronic kidney disease    stage 3 CKD   Diabetes mellitus without complication (HCC)    Discharge from left nipple    Hyperlipidemia     Past Surgical History:  Procedure Laterality Date   BREAST BIOPSY Left 10/31/2017   Procedure: LEFT BREAST BIOPSY;  Surgeon: Ovidio Kin, MD;  Location: [facility];  Service: General;  Laterality: Left;   CESAREAN SECTION     COLONOSCOPY     OVARIAN CYST REMOVAL      Current Medications: Current Meds  Medication Sig   atorvastatin (LIPITOR)  10 MG tablet Take 10 mg by mouth daily. PT. REPORTS TAKING 2 TIMES A WEEK. TAKES TUESDAYS AND SATURDAYS   dapagliflozin propanediol (FARXIGA) 10 MG TABS tablet Take 10 mg by mouth daily.   ezetimibe (ZETIA) 10 MG tablet Take 10 mg by mouth daily.   Insulin Glargine (BASAGLAR KWIKPEN [place]) Inject into the skin daily. 22 UNITS DAILY   metoprolol succinate (TOPROL-XL) 50 MG 24 hr tablet Take 50 mg by mouth daily. Take with or immediately following a meal.     Allergies:   Patient has no known allergies.   Social History   Tobacco Use   Smoking status: Never   Smokeless tobacco: Never  Substance Use Topics   Alcohol use: Never   Drug use: Never     Family History: The patient's family history includes Breast cancer in her maternal aunt and paternal aunt; Breast cancer (age of onset: 39) in her mother; Breast cancer (age of onset: 29) in her sister.  ROS:   Please see the history of present illness.    All other systems reviewed and are negative.  EKGs/Labs/Other Studies Reviewed:    The following studies were reviewed today:  EKG:   EKG Interpretation Date/Time:  Monday November 12 2022 16:18:29 EDT Ventricular Rate:  77 PR Interval:  148 QRS Duration:  80 QT Interval:  374 QTC Calculation: 423 R Axis:   12  Text Interpretation: Sinus rhythm with occasional Premature ventricular complexes When compared with ECG of 25-Oct-2017 14:46, Premature ventricular complexes are now Present Confirmed by Weston Brass ([id number]) on 11/12/2022  4:57:19 PM   Last: NSR, LAE 05/03/20 NSR, possible LAE   Recent Labs: No results found for requested labs within last 365 days.  Recent Lipid Panel No results found for: "CHOL", "TRIG", "HDL", "CHOLHDL", "VLDL", "LDLCALC", "LDLDIRECT"  Physical Exam:    VS:  BP 104/72   Pulse 77   Ht 5\' 5"  (1.651 m)   Wt 177 lb (80.3 kg)   SpO2 100%   BMI 29.45 kg/m     Wt Readings from Last 5 Encounters:  11/12/22 177 lb (80.3 kg)  12/13/20 177 lb 12.8  oz (80.6 kg)  05/03/20 179 lb (81.2 kg)  03/18/20 178 lb (80.7 kg)  02/10/20 178 lb (80.7 kg)    Constitutional: No acute distress Eyes: sclera non-icteric, normal conjunctiva and lids ENMT: normal dentition, moist mucous membranes Cardiovascular: regular rhythm, normal rate, no murmurs. S1 and S2 normal. Radial pulses normal bilaterally. No jugular venous distention.  Respiratory: clear to auscultation bilaterally GI : normal bowel sounds, soft and nontender. No distention.   MSK: extremities warm, well perfused. No edema.  NEURO: grossly nonfocal exam, moves all extremities. PSYCH: alert and oriented x 3, normal mood and affect.   ASSESSMENT:    1. Essential hypertension   2. Precordial pain   3. Hyperlipidemia, unspecified hyperlipidemia type   4. CKD (chronic kidney disease) stage 4, GFR 15-29 ml/min (HCC)      PLAN:    Essential hypertension - Plan: EKG 12-Lead -Medications changed after hospitalization for sepsis, now taking metoprolol for management of blood pressure with good blood pressure control.  Precordial pain - no recurrence. Minimal CAD on CCTA recently.  No chest pain during hospitalization with severe sepsis.  Hyperlipidemia, unspecified hyperlipidemia type Myalgia - continue atorvastatin 10 mg 2 times a week, LDL 68, triglycerides 164. Lipids overall optimized.  CKD stage IV-most recent GFR 24, patient is managed by nephrology.  No new medication recommendations to optimize cardiac or renal function.  Total time of encounter: 30 minutes total time of encounter, including 20 minutes spent in face-to-face patient care on the date of this encounter. This time includes coordination of care and counseling regarding above mentioned problem list. Remainder of non-face-to-face time involved reviewing chart documents/testing relevant to the patient encounter and documentation in the medical record. I have independently reviewed documentation from referring provider.    Weston Brass, MD, [facility]  [facility]     Medication Adjustments/Labs and Tests Ordered: Current medicines are reviewed at length with the patient today.  Concerns regarding medicines are outlined above.   Orders Placed This Encounter  Procedures   EKG 12-Lead    No orders of the defined types were placed in this encounter.    Patient Instructions  Medication Instructions:  Your physician recommends that you continue on your current medications as directed. Please refer to the Current Medication list given to you today.  *If you need a refill on your cardiac medications before your next appointment, please call your pharmacy*   Follow-Up: At [facility], you and your health needs are our priority.  As part of our continuing mission to provide you with exceptional heart care, we have created designated Provider Care Teams.  These Care Teams include your primary Cardiologist (physician) and Advanced Practice Providers (APPs -  Physician Assistants and Nurse Practitioners) who all work together to provide you with the care you need, when you need it.  We recommend signing up for the patient portal called "MyChart".  Sign  up information is provided on this After Visit Summary.  MyChart is used to connect with patients for Virtual Visits (Telemedicine).  Patients are able to view lab/test results, encounter notes, upcoming appointments, etc.  Non-urgent messages can be sent to your provider as well.   To learn more about what you can do with MyChart, go to [email or web address].    Your next appointment:   12 month(s)  Provider:   Dr. Jacques Navy

## 2022-11-22 ENCOUNTER — Other Ambulatory Visit: Payer: Self-pay | Admitting: Family Medicine

## 2022-11-22 DIAGNOSIS — Z1231 Encounter for screening mammogram for malignant neoplasm of breast: Secondary | ICD-10-CM

## 2023-01-24 ENCOUNTER — Ambulatory Visit: Payer: Medicare Other | Admitting: Internal Medicine

## 2023-02-12 ENCOUNTER — Ambulatory Visit
Admission: RE | Admit: 2023-02-12 | Discharge: 2023-02-12 | Disposition: A | Discharge status: Home / Self Care | Payer: Medicare Other | Source: Ambulatory Visit | Attending: Family Medicine | Admitting: Family Medicine

## 2023-02-12 DIAGNOSIS — Z1231 Encounter for screening mammogram for malignant neoplasm of breast: Secondary | ICD-10-CM

## 2023-11-17 ENCOUNTER — Other Ambulatory Visit: Payer: Self-pay | Admitting: Medical Genetics

## 2024-01-09 ENCOUNTER — Ambulatory Visit: Attending: Internal Medicine | Admitting: Internal Medicine

## 2024-01-09 VITALS — BP 119/54 | Ht 65.0 in | Wt 186.0 lb

## 2024-01-09 DIAGNOSIS — E782 Mixed hyperlipidemia: Secondary | ICD-10-CM | POA: Diagnosis present

## 2024-01-09 DIAGNOSIS — N184 Chronic kidney disease, stage 4 (severe): Secondary | ICD-10-CM | POA: Insufficient documentation

## 2024-01-09 DIAGNOSIS — I471 Supraventricular tachycardia, unspecified: Secondary | ICD-10-CM | POA: Insufficient documentation

## 2024-01-09 DIAGNOSIS — I10 Essential (primary) hypertension: Secondary | ICD-10-CM | POA: Diagnosis present

## 2024-01-09 DIAGNOSIS — R002 Palpitations: Secondary | ICD-10-CM | POA: Insufficient documentation

## 2024-01-09 DIAGNOSIS — R072 Precordial pain: Secondary | ICD-10-CM

## 2024-01-09 NOTE — Progress Notes (Signed)
 Cardiology Office Note:  .   Date:  01/09/2024  ID:  Tamara Pruitt, DOB 08-11-1945, MRN [id number] PCP: Cleotilde Planas, MD  [facility] Providers Cardiologist:  None    History of Present Illness: .   Tamara Pruitt is a 78 y.o. female.  Discussed the use of AI scribe software for clinical note transcription with the patient, who gave verbal consent to proceed.  History of Present Illness Tamara Pruitt is a 78 year old female with diabetes, chronic kidney disease, and hyperlipidemia who presents for a cardiovascular follow-up.  Her blood pressure is well controlled on metoprolol  50 mg daily. She has not experienced any recurrence of precordial pain or episodes of supraventricular tachycardia since her last visit. There is no chest pain or shortness of breath.  For hyperlipidemia, she takes atorvastatin 10 mg twice a week and Zetia daily, maintaining her LDL at 62 mg/dL. Her cholesterol levels have improved with the addition of Zetia.  She has chronic kidney disease, with stable kidney function and no proteinuria. She discontinued Farxiga due to cost and lack of perceived benefit. Insulin therapy initiated in May 2024 has reduced her A1c from over 8% to the mid-7% range, with efforts to lower it further.    ROS: negative except per HPI above.  Studies Reviewed: SABRA   EKG Interpretation Date/Time:  Thursday January 09 2024 15:31:21 EDT Ventricular Rate:  78 PR Interval:  146 QRS Duration:  76 QT Interval:  368 QTC Calculation: 419 R Axis:   38  Text Interpretation: Sinus rhythm with occasional Premature ventricular complexes When compared with ECG of 12-Nov-2022 16:18, No significant change was found Confirmed by Loni Rushing ([id number]) on 01/09/2024 3:51:21 PM    Results LABS LDL: 62 A1c: 7.3  RADIOLOGY Coronary CTA: Minimal CAD  DIAGNOSTIC EKG: Normal (01/09/2024) Risk Assessment/Calculations:       Physical Exam:   VS:  BP (!) 119/54   Ht 5' 5 (1.651  m)   Wt 186 lb (84.4 kg)   SpO2 97%   BMI 30.95 kg/m    Wt Readings from Last 3 Encounters:  01/09/24 186 lb (84.4 kg)  11/12/22 177 lb (80.3 kg)  12/13/20 177 lb 12.8 oz (80.6 kg)     Physical Exam GENERAL: Alert, cooperative, well developed, no acute distress. HEENT: Normocephalic, normal oropharynx, moist mucous membranes. CHEST: Clear to auscultation bilaterally, no wheezes, rhonchi, or crackles. CARDIOVASCULAR: Normal heart rate and rhythm, S1 and S2 normal without murmurs. ABDOMEN: Soft, non-tender, non-distended, without organomegaly, normal bowel sounds. EXTREMITIES: No cyanosis or edema. NEUROLOGICAL: Cranial nerves grossly intact, moves all extremities without gross motor or sensory deficit.   ASSESSMENT AND PLAN: .    Assessment and Plan Assessment & Plan Supraventricular tachycardia No recent episodes. Heart rate controlled with metoprolol . EKG stable. - Continue metoprolol  50 mg daily.  Hypertension Blood pressure controlled on metoprolol . Occasional low readings without hypotension symptoms. Today's reading slightly low, possibly due to dehydration. - Continue metoprolol  50 mg daily.  Type 2 diabetes mellitus, insulin requiring Insulin improved A1c from over 8 to mid-7s. A1c goal less than 7 is reasonable for her age. Weight gain noted. Cataract surgery consideration acceptable with A1c in the 7s. - Continue insulin therapy. - Monitor weight and dietary intake.  Hyperlipidemia LDL cholesterol controlled at 62 with atorvastatin and Zetia. Levels stable. - Continue atorvastatin 10 mg twice a week. - Continue Zetia 10 mg daily.        Rushing Loni, MD, FACC

## 2024-01-09 NOTE — Patient Instructions (Signed)
 Medication Instructions:  Your physician recommends that you continue on your current medications as directed. Please refer to the Current Medication list given to you today.  *If you need a refill on your cardiac medications before your next appointment, please call your pharmacy*  Follow-Up: At [facility], you and your health needs are our priority.  As part of our continuing mission to provide you with exceptional heart care, our providers are all part of one team.  This team includes your primary Cardiologist (physician) and Advanced Practice Providers or APPs (Physician Assistants and Nurse Practitioners) who all work together to provide you with the care you need, when you need it.  Your next appointment:   As needed with Dr. Loni Lanius have graduated from cardiology!! :)

## 2024-01-13 ENCOUNTER — Other Ambulatory Visit: Payer: Self-pay | Admitting: Family Medicine

## 2024-01-13 DIAGNOSIS — Z1231 Encounter for screening mammogram for malignant neoplasm of breast: Secondary | ICD-10-CM

## 2024-02-11 ENCOUNTER — Other Ambulatory Visit: Payer: Self-pay

## 2024-02-11 DIAGNOSIS — Z006 Encounter for examination for normal comparison and control in clinical research program: Secondary | ICD-10-CM

## 2024-02-13 ENCOUNTER — Ambulatory Visit
Admission: RE | Admit: 2024-02-13 | Discharge: 2024-02-13 | Disposition: A | Discharge status: Home / Self Care | Source: Ambulatory Visit | Attending: Family Medicine | Admitting: Family Medicine

## 2024-02-13 DIAGNOSIS — Z1231 Encounter for screening mammogram for malignant neoplasm of breast: Secondary | ICD-10-CM

## 2024-02-21 LAB — GENECONNECT MOLECULAR SCREEN: Genetic Analysis Overall Interpretation: NEGATIVE
